# Patient Record
Sex: Male | Born: 1940 | Race: Black or African American | Hispanic: No | Marital: Single | Smoking: Never smoker
Health system: Southern US, Community
[De-identification: ages and names within clinical notes are randomized; demographics above are authoritative.]

## PROBLEM LIST (undated history)

## (undated) DIAGNOSIS — E7800 Pure hypercholesterolemia, unspecified: Secondary | ICD-10-CM

## (undated) DIAGNOSIS — E119 Type 2 diabetes mellitus without complications: Secondary | ICD-10-CM

## (undated) DIAGNOSIS — I10 Essential (primary) hypertension: Secondary | ICD-10-CM

## (undated) DIAGNOSIS — N400 Enlarged prostate without lower urinary tract symptoms: Secondary | ICD-10-CM

---

## 2001-03-09 ENCOUNTER — Ambulatory Visit (HOSPITAL_COMMUNITY): Admission: RE | Admit: 2001-03-09 | Discharge: 2001-03-09 | Payer: Self-pay | Admitting: Internal Medicine

## 2001-03-09 ENCOUNTER — Encounter: Payer: Self-pay | Admitting: Internal Medicine

## 2001-03-23 ENCOUNTER — Ambulatory Visit (HOSPITAL_COMMUNITY): Admission: RE | Admit: 2001-03-23 | Discharge: 2001-03-23 | Payer: Self-pay | Admitting: Internal Medicine

## 2001-03-23 ENCOUNTER — Encounter: Payer: Self-pay | Admitting: Internal Medicine

## 2016-12-09 ENCOUNTER — Other Ambulatory Visit: Payer: Self-pay | Admitting: Urology

## 2016-12-09 DIAGNOSIS — R972 Elevated prostate specific antigen [PSA]: Secondary | ICD-10-CM

## 2017-01-11 ENCOUNTER — Ambulatory Visit
Admission: RE | Admit: 2017-01-11 | Discharge: 2017-01-11 | Disposition: A | Discharge status: Home / Self Care | Payer: Medicare HMO | Source: Ambulatory Visit | Attending: Urology | Admitting: Urology

## 2017-01-11 DIAGNOSIS — R972 Elevated prostate specific antigen [PSA]: Secondary | ICD-10-CM

## 2017-01-11 MED ORDER — GADOBENATE DIMEGLUMINE 529 MG/ML IV SOLN
15.0000 mL | Freq: Once | INTRAVENOUS | Status: AC | PRN
Start: 2017-01-11 — End: 2017-01-11
  Administered 2017-01-11: 15 mL via INTRAVENOUS

## 2017-01-19 ENCOUNTER — Other Ambulatory Visit: Payer: Self-pay

## 2017-03-13 ENCOUNTER — Emergency Department (HOSPITAL_COMMUNITY): Payer: Medicare HMO

## 2017-03-13 ENCOUNTER — Encounter (HOSPITAL_COMMUNITY): Payer: Self-pay

## 2017-03-13 ENCOUNTER — Inpatient Hospital Stay (HOSPITAL_COMMUNITY)
Admission: EM | Admit: 2017-03-13 | Discharge: 2017-03-16 | DRG: 726 | Disposition: A | Discharge status: Home / Self Care | Payer: Medicare HMO | Attending: Family Medicine | Admitting: Family Medicine

## 2017-03-13 DIAGNOSIS — N179 Acute kidney failure, unspecified: Secondary | ICD-10-CM | POA: Diagnosis present

## 2017-03-13 DIAGNOSIS — N138 Other obstructive and reflux uropathy: Secondary | ICD-10-CM | POA: Diagnosis present

## 2017-03-13 DIAGNOSIS — E119 Type 2 diabetes mellitus without complications: Secondary | ICD-10-CM

## 2017-03-13 DIAGNOSIS — R339 Retention of urine, unspecified: Secondary | ICD-10-CM | POA: Diagnosis present

## 2017-03-13 DIAGNOSIS — N390 Urinary tract infection, site not specified: Secondary | ICD-10-CM | POA: Diagnosis present

## 2017-03-13 DIAGNOSIS — B9620 Unspecified Escherichia coli [E. coli] as the cause of diseases classified elsewhere: Secondary | ICD-10-CM | POA: Diagnosis present

## 2017-03-13 DIAGNOSIS — E876 Hypokalemia: Secondary | ICD-10-CM | POA: Diagnosis present

## 2017-03-13 DIAGNOSIS — I10 Essential (primary) hypertension: Secondary | ICD-10-CM | POA: Diagnosis present

## 2017-03-13 DIAGNOSIS — Z7984 Long term (current) use of oral hypoglycemic drugs: Secondary | ICD-10-CM

## 2017-03-13 DIAGNOSIS — N400 Enlarged prostate without lower urinary tract symptoms: Secondary | ICD-10-CM | POA: Diagnosis present

## 2017-03-13 DIAGNOSIS — R34 Anuria and oliguria: Secondary | ICD-10-CM | POA: Diagnosis present

## 2017-03-13 DIAGNOSIS — N401 Enlarged prostate with lower urinary tract symptoms: Secondary | ICD-10-CM | POA: Diagnosis not present

## 2017-03-13 DIAGNOSIS — Z79899 Other long term (current) drug therapy: Secondary | ICD-10-CM

## 2017-03-13 DIAGNOSIS — E785 Hyperlipidemia, unspecified: Secondary | ICD-10-CM | POA: Diagnosis present

## 2017-03-13 HISTORY — DX: Type 2 diabetes mellitus without complications: E11.9

## 2017-03-13 HISTORY — DX: Essential (primary) hypertension: I10

## 2017-03-13 HISTORY — DX: Pure hypercholesterolemia, unspecified: E78.00

## 2017-03-13 HISTORY — DX: Benign prostatic hyperplasia without lower urinary tract symptoms: N40.0

## 2017-03-13 LAB — URINALYSIS, ROUTINE W REFLEX MICROSCOPIC
Bilirubin Urine: NEGATIVE
Glucose, UA: NEGATIVE mg/dL
Ketones, ur: NEGATIVE mg/dL
Nitrite: NEGATIVE
Protein, ur: 30 mg/dL — AB
Specific Gravity, Urine: 1.011 (ref 1.005–1.030)
pH: 5 (ref 5.0–8.0)

## 2017-03-13 LAB — CBC WITH DIFFERENTIAL/PLATELET
BASOS ABS: 0 10*3/uL (ref 0.0–0.1)
Basophils Relative: 0 %
EOS PCT: 0 %
Eosinophils Absolute: 0 10*3/uL (ref 0.0–0.7)
HEMATOCRIT: 32.5 % — AB (ref 39.0–52.0)
HEMOGLOBIN: 11.1 g/dL — AB (ref 13.0–17.0)
LYMPHS PCT: 5 %
Lymphs Abs: 1.4 10*3/uL (ref 0.7–4.0)
MCH: 31.4 pg (ref 26.0–34.0)
MCHC: 34.2 g/dL (ref 30.0–36.0)
MCV: 91.8 fL (ref 78.0–100.0)
MONOS PCT: 7 %
Monocytes Absolute: 2 10*3/uL — ABNORMAL HIGH (ref 0.1–1.0)
NEUTROS PCT: 88 %
Neutro Abs: 24.6 10*3/uL — ABNORMAL HIGH (ref 1.7–7.7)
Platelets: 163 10*3/uL (ref 150–400)
RBC: 3.54 MIL/uL — AB (ref 4.22–5.81)
RDW: 13.6 % (ref 11.5–15.5)
WBC: 28 10*3/uL — AB (ref 4.0–10.5)

## 2017-03-13 LAB — BASIC METABOLIC PANEL
ANION GAP: 10 (ref 5–15)
BUN: 20 mg/dL (ref 6–20)
CHLORIDE: 99 mmol/L — AB (ref 101–111)
CO2: 25 mmol/L (ref 22–32)
Calcium: 8.4 mg/dL — ABNORMAL LOW (ref 8.9–10.3)
Creatinine, Ser: 2.08 mg/dL — ABNORMAL HIGH (ref 0.61–1.24)
GFR calc Af Amer: 34 mL/min — ABNORMAL LOW (ref >60)
GFR, EST NON AFRICAN AMERICAN: 29 mL/min — AB (ref >60)
Glucose, Bld: 159 mg/dL — ABNORMAL HIGH (ref 65–99)
POTASSIUM: 3.8 mmol/L (ref 3.5–5.1)
SODIUM: 134 mmol/L — AB (ref 135–145)

## 2017-03-13 LAB — I-STAT CG4 LACTIC ACID, ED: Lactic Acid, Venous: 1.69 mmol/L (ref 0.5–1.9)

## 2017-03-13 LAB — GLUCOSE, CAPILLARY: GLUCOSE-CAPILLARY: 137 mg/dL — AB (ref 65–99)

## 2017-03-13 MED ORDER — INSULIN ASPART 100 UNIT/ML ~~LOC~~ SOLN
0.0000 [IU] | Freq: Three times a day (TID) | SUBCUTANEOUS | Status: DC
  Administered 2017-03-14 – 2017-03-15 (×2): 1 [IU] via SUBCUTANEOUS
  Administered 2017-03-16: 7 [IU] via SUBCUTANEOUS
  Administered 2017-03-16: 1 [IU] via SUBCUTANEOUS

## 2017-03-13 MED ORDER — ACETAMINOPHEN 325 MG PO TABS
650.0000 mg | ORAL_TABLET | Freq: Four times a day (QID) | ORAL | Status: DC | PRN
  Administered 2017-03-14 – 2017-03-15 (×2): 650 mg via ORAL
  Filled 2017-03-13 (×2): qty 2

## 2017-03-13 MED ORDER — TAMSULOSIN HCL 0.4 MG PO CAPS
0.8000 mg | ORAL_CAPSULE | Freq: Every day | ORAL | Status: DC
  Administered 2017-03-13 – 2017-03-14 (×2): 0.8 mg via ORAL
  Filled 2017-03-13 (×2): qty 2

## 2017-03-13 MED ORDER — INSULIN ASPART 100 UNIT/ML ~~LOC~~ SOLN: 0.0000 [IU] | Freq: Every day | SUBCUTANEOUS | Status: DC

## 2017-03-13 MED ORDER — DEXTROSE 5 % IV SOLN
1.0000 g | Freq: Once | INTRAVENOUS | Status: AC
  Administered 2017-03-13: 1 g via INTRAVENOUS
  Filled 2017-03-13: qty 10

## 2017-03-13 MED ORDER — ENOXAPARIN SODIUM 30 MG/0.3ML ~~LOC~~ SOLN
30.0000 mg | SUBCUTANEOUS | Status: DC
  Administered 2017-03-13: 30 mg via SUBCUTANEOUS
  Filled 2017-03-13: qty 0.3

## 2017-03-13 MED ORDER — HYDRALAZINE HCL 20 MG/ML IJ SOLN: 5.0000 mg | INTRAMUSCULAR | Status: DC | PRN

## 2017-03-13 MED ORDER — DOCUSATE SODIUM 100 MG PO CAPS
100.0000 mg | ORAL_CAPSULE | Freq: Two times a day (BID) | ORAL | Status: DC
  Administered 2017-03-13 – 2017-03-16 (×4): 100 mg via ORAL
  Filled 2017-03-13 (×6): qty 1

## 2017-03-13 MED ORDER — LACTATED RINGERS IV SOLN
INTRAVENOUS | Status: DC
  Administered 2017-03-13 – 2017-03-16 (×5): via INTRAVENOUS

## 2017-03-13 MED ORDER — ACETAMINOPHEN 650 MG RE SUPP: 650.0000 mg | Freq: Four times a day (QID) | RECTAL | Status: DC | PRN

## 2017-03-13 MED ORDER — ONDANSETRON HCL 4 MG PO TABS: 4.0000 mg | ORAL_TABLET | Freq: Four times a day (QID) | ORAL | Status: DC | PRN

## 2017-03-13 MED ORDER — DEXTROSE 5 % IV SOLN
1.0000 g | INTRAVENOUS | Status: DC
  Administered 2017-03-14 – 2017-03-15 (×2): 1 g via INTRAVENOUS
  Filled 2017-03-13 (×3): qty 10

## 2017-03-13 MED ORDER — ONDANSETRON HCL 4 MG/2ML IJ SOLN: 4.0000 mg | Freq: Four times a day (QID) | INTRAMUSCULAR | Status: DC | PRN

## 2017-03-13 MED ORDER — ATORVASTATIN CALCIUM 40 MG PO TABS
40.0000 mg | ORAL_TABLET | Freq: Every day | ORAL | Status: DC
  Administered 2017-03-13 – 2017-03-15 (×3): 40 mg via ORAL
  Filled 2017-03-13 (×3): qty 1

## 2017-03-13 NOTE — ED Notes (Signed)
Bladder scan shows 300 cc in bladder. MD made aware.

## 2017-03-13 NOTE — Progress Notes (Signed)
Rocephin for UTI = 1gm IV every 24 hours. Dose stable for age, weight, renal function and indication. No pharmacokinetic monitoring needed. Sign off. Mady Gemma, [facility] 03/13/2017 9:02 PM

## 2017-03-13 NOTE — ED Triage Notes (Signed)
Having difficulty voiding in the last 2 days.  C/o abdominal pain, rating pain 10/10.

## 2017-03-13 NOTE — ED Notes (Signed)
Patient transported to X-ray 

## 2017-03-13 NOTE — ED Provider Notes (Signed)
[facility] DEPT Provider Note   CSN: [id number] Arrival date & time: 03/13/17  1239     History   Chief Complaint Chief Complaint  Patient presents with  . Urinary Retention    HPI Craig Sims is a 76 y.o. male.  The patient was sent from his PCP office to be evaluated for decreased urinary output.  He saw the doctor today and was sent here.  Patient reports no urinary output for 2 days, and constipation with "small bowel movement," today.  There is been no fever, vomiting, cough, weakness or dizziness.  There are no other known modifying factors.   HPI  Past Medical History:  Diagnosis Date  . Diabetes mellitus without complication (HCC)   . High cholesterol   . Hypertension   . Prostate atrophy     There are no active problems to display for this patient.   History reviewed. No pertinent surgical history.     Home Medications    Prior to Admission medications   Medication Sig Start Date End Date Taking? Authorizing Provider  atorvastatin (LIPITOR) 40 MG tablet Take 40 mg by mouth daily.   Yes [provider]  furosemide (LASIX) 20 MG tablet Take 20 mg by mouth daily.   Yes [provider]  lisinopril (PRINIVIL,ZESTRIL) 5 MG tablet Take 5 mg by mouth daily.   Yes [provider]  metFORMIN (GLUCOPHAGE) 500 MG tablet Take 500 mg by mouth daily with breakfast.   Yes [provider]  tamsulosin (FLOMAX) 0.4 MG CAPS capsule Take 0.4 mg by mouth daily.   Yes [provider]    Family History No family history on file.  Social History Social History  Substance Use Topics  . Smoking status: Never Smoker  . Smokeless tobacco: Never Used  . Alcohol use No     Allergies   Patient has no known allergies.   Review of Systems Review of Systems  All other systems reviewed and are negative.    Physical Exam Updated Vital Signs BP (!) 144/68   Pulse 78   Temp 99.6 F (37.6 C) (Oral)   Resp 18   Ht 5'  10" (1.778 m)   Wt 70.8 kg (156 lb)   SpO2 97%   BMI 22.38 kg/m   Physical Exam  Constitutional: He is oriented to person, place, and time. He appears well-developed.  Elderly, frail  HENT:  Head: Normocephalic and atraumatic.  Right Ear: External ear normal.  Left Ear: External ear normal.  Eyes: Pupils are equal, round, and reactive to light. Conjunctivae and EOM are normal.  Neck: Normal range of motion and phonation normal. Neck supple.  Cardiovascular: Normal rate, regular rhythm and normal heart sounds.   Pulmonary/Chest: Effort normal and breath sounds normal. He exhibits no bony tenderness.  Abdominal: Soft. He exhibits no distension and no mass. There is tenderness (Tender suprapubic, moderate with likely palpable urinary bladder.). There is no guarding.  Musculoskeletal: Normal range of motion.  Neurological: He is alert and oriented to person, place, and time. No cranial nerve deficit or sensory deficit. He exhibits normal muscle tone. Coordination normal.  Skin: Skin is warm, dry and intact.  Psychiatric: He has a normal mood and affect. His behavior is normal.  Nursing note and vitals reviewed.    ED Treatments / Results  Labs (all labs ordered are listed, but only abnormal results are displayed) Labs Reviewed  BASIC METABOLIC PANEL - Abnormal; Notable for the following:  Result Value   Sodium 134 (*)    Chloride 99 (*)    Glucose, Bld 159 (*)    Creatinine, Ser 2.08 (*)    Calcium 8.4 (*)    GFR calc non Af Amer 29 (*)    GFR calc Af Amer 34 (*)    All other components within normal limits  CBC WITH DIFFERENTIAL/PLATELET - Abnormal; Notable for the following:    WBC 28.0 (*)    RBC 3.54 (*)    Hemoglobin 11.1 (*)    HCT 32.5 (*)    Neutro Abs 24.6 (*)    Monocytes Absolute 2.0 (*)    All other components within normal limits  URINALYSIS, ROUTINE W REFLEX MICROSCOPIC - Abnormal; Notable for the following:    APPearance HAZY (*)    Hgb urine dipstick  LARGE (*)    Protein, ur 30 (*)    Leukocytes, UA TRACE (*)    Bacteria, UA MANY (*)    Squamous Epithelial / LPF 0-5 (*)    All other components within normal limits  CULTURE, BLOOD (ROUTINE X 2)  CULTURE, BLOOD (ROUTINE X 2)  URINE CULTURE  I-STAT CG4 LACTIC ACID, ED   BUN  Date Value Ref Range Status  03/13/2017 20 6 - 20 mg/dL Final   Creatinine, Ser  Date Value Ref Range Status  03/13/2017 2.08 (H) 0.61 - 1.24 mg/dL Final     EKG  EKG Interpretation None       Radiology Dg Abd Acute W/chest  Result Date: 03/13/2017 CLINICAL DATA:  Abdominal pain and difficulty voiding for 2 days. EXAM: DG ABDOMEN ACUTE W/ 1V CHEST COMPARISON:  Pelvic MRI on 01/11/2017 FINDINGS: The upright chest x-ray demonstrates normal cardiac size. There is mild tortuosity and calcification of the thoracic aorta. The lungs are clear. No pleural effusion. Two views of the abdomen demonstrate an unremarkable bowel gas pattern. No findings for obstruction an or perforation. The soft tissue shadows are grossly maintained. No worrisome calcifications. The bony structures are intact. IMPRESSION: No acute cardiopulmonary findings. No plain film findings for acute abdominal process. Electronically Signed   By: Rudie Meyer M.D.   On: 03/13/2017 15:54    Procedures Procedures (including critical care time)  Medications Ordered in ED Medications  cefTRIAXone (ROCEPHIN) 1 g in dextrose 5 % 50 mL IVPB (0 g Intravenous Stopped 03/13/17 1750)     Initial Impression / Assessment and Plan / ED Course  I have reviewed the triage vital signs and the nursing notes.  Pertinent labs & imaging results that were available during my care of the patient were reviewed by me and considered in my medical decision making (see chart for details).  Clinical Course as of Mar 14 1843  Mon Mar 13, 2017  1628 High WBC: (!) 28.0 [EW]  1629 Slightly low Hemoglobin: (!) 11.1 [EW]  1629 High Glucose: (!) 159 [EW]  1629 High, no  available comparison Creatinine: (!) 2.08 [EW]  1629 Low GFR, Est Non African American: (!) 29 [EW]  1629 Abnormal RBC / HPF: TOO NUMEROUS TO COUNT [EW]  1630 Nonspecific elevation, urine culture ordered WBC, UA: 6-30 [EW]  1630 Abnormal Bacteria, UA: (!) MANY [EW]  [id number] Patient with fever and primarily urinary symptoms.  Urinalysis abnormal, with normal initial temperature following Tylenol treatment.  Marked elevation of white blood cell count.  Will screen with lactate, and order blood cultures, and initiate antibiotic treatment.  [EW]  1645 Bladder scan showed 300 cc.  At this time the patient has had 1200 cc urinary blood, indicating significant urinary retention after placement of Foley catheter.  Patient and sons are updated on findings and plan.  [EW]    Clinical Course User Index [EW] Mancel Bale, MD     Patient Vitals for the past 24 hrs:  BP Temp Temp src Pulse Resp SpO2 Height Weight  03/13/17 1500 (!) 144/68 - - 78 - 97 % - -  03/13/17 1313 139/67 - - - - - - -  03/13/17 1312 - 99.6 F (37.6 C) Oral 94 18 100 % - -  03/13/17 1311 - - - - - -  (1.778 m) 70.8 kg (156 lb)    5:05 PM Reevaluation with update and discussion. After initial assessment and treatment, an updated evaluation reveals patient feels well and wants to go home.  Findings discussed with patient and family members.  Patient understands that he will require observation evaluation, and further treatment.  Lactate ordered. Arali Somera L   6:41 PM-Consult complete with hospitalist. Patient case explained and discussed.  She agrees to admit patient for further evaluation and treatment. Call ended at 18: 58     Final Clinical Impressions(s) / ED Diagnoses   Final diagnoses:  Urinary retention  Urinary tract infection without hematuria, site unspecified    Decreased urinary output, likely secondary to urinary retention from prostatic hypertrophy.  Likely urinary tract infection, as a contributory  factor.  Marked white blood cell count elevation, and normal lactate.  Patient with symptomatic improvement following placement of Foley catheter.  Creatinine elevated, without recent baseline for comparison.   Nursing Notes Reviewed/ Care Coordinated Applicable Imaging Reviewed Interpretation of Laboratory Data incorporated into ED treatment  Plan: Admit   New Prescriptions New Prescriptions   No medications on file     Mancel Bale, MD 03/13/17 1859

## 2017-03-13 NOTE — H&P (Signed)
History and Physical    Craig Sims:[id number] DOB: 10/26/1940 DOA: 03/13/2017  PCP: Smith Robert, MD Consultants:  [facility] - Lillette Boxer - nephrology Patient coming from:  Home - lives alone; [facility]: sons, [phone]  Chief Complaint: urinary retention  HPI: Craig Sims is a 76 y.o. male with medical history significant of HTN, HLD, DM, BPH< and possible dementia presenting because he was unable to pass urine.  He said it has been a couple of days now.  Anuric completely.  He has not had urinary symptoms prior.  He was feeling a bit dizzy at times.  + headache.  Lower abdominal pain prior to foley, resolved since placement of foley.  Fever to 103 earlier today at his PCP office, given Tylenol with improvement.  History was provided by his 3 sons since the patient was sleeping throughout the evaluation.   ED Course: Urinary retention from BPH.  Also with UTI.  Marked WBC elevation, normal lactate.  Symptomatically improved with foley placement.  Acute renal failure.  Review of Systems: As per HPI; otherwise review of systems reviewed and negative.   Ambulatory Status:  Ambulates without assistance  Past Medical History:  Diagnosis Date  . BPH (benign prostatic hyperplasia)   . Diabetes mellitus without complication (HCC)   . High cholesterol   . Hypertension     History reviewed. No pertinent surgical history.  Social History   Social History  . Marital status: Single    Spouse name: N/A  . Number of children: N/A  . Years of education: N/A   Occupational History  . retired    Social History Main Topics  . Smoking status: Never Smoker  . Smokeless tobacco: Never Used  . Alcohol use No  . Drug use: No  . Sexual activity: Not on file   Other Topics Concern  . Not on file   Social History Narrative  . No narrative on file    No Known Allergies  Family History  Problem Relation Age of Onset  . Cancer Father     Prior to Admission  medications   Medication Sig Start Date End Date Taking? Authorizing Provider  atorvastatin (LIPITOR) 40 MG tablet Take 40 mg by mouth daily.   Yes [provider]  furosemide (LASIX) 20 MG tablet Take 20 mg by mouth daily.   Yes [provider]  lisinopril (PRINIVIL,ZESTRIL) 5 MG tablet Take 5 mg by mouth daily.   Yes [provider]  metFORMIN (GLUCOPHAGE) 500 MG tablet Take 500 mg by mouth daily with breakfast.   Yes [provider]  tamsulosin (FLOMAX) 0.4 MG CAPS capsule Take 0.4 mg by mouth daily.   Yes [provider]    Physical Exam: Vitals:   03/13/17 1311 03/13/17 1312 03/13/17 1313 03/13/17 1500  BP:   139/67 (!) 144/68  Pulse:  94  78  Resp:  18    Temp:  99.6 F (37.6 C)    TempSrc:  Oral    SpO2:  100%  97%  Weight: 70.8 kg (156 lb)     Height:  (1.778 m)        General:  Appears calm and comfortable and is NAD; he slept throughout the history and physical Eyes:  normal lids ENT:  Mmm  Neck:  no LAD, masses or thyromegaly Cardiovascular:  RRR, no m/r/g. No LE edema.  Respiratory:   CTA bilaterally with no wheezes/rales/rhonchi.  Normal respiratory effort. Abdomen:  soft, NT,  ND, NABS Skin:  no rash or induration seen on limited exam Musculoskeletal:  grossly normal tone BUE/BLE, good ROM, no bony abnormality Psychiatric:  Somnolent, appears comfortable Neurologic: unable to perform    Radiological Exams on Admission: Dg Abd Acute W/chest  Result Date: 03/13/2017 CLINICAL DATA:  Abdominal pain and difficulty voiding for 2 days. EXAM: DG ABDOMEN ACUTE W/ 1V CHEST COMPARISON:  Pelvic MRI on 01/11/2017 FINDINGS: The upright chest x-ray demonstrates normal cardiac size. There is mild tortuosity and calcification of the thoracic aorta. The lungs are clear. No pleural effusion. Two views of the abdomen demonstrate an unremarkable bowel gas pattern. No findings for obstruction an or perforation. The soft tissue shadows  are grossly maintained. No worrisome calcifications. The bony structures are intact. IMPRESSION: No acute cardiopulmonary findings. No plain film findings for acute abdominal process. Electronically Signed   By: Rudie Meyer M.D.   On: 03/13/2017 15:54    EKG: none   Labs on Admission: I have personally reviewed the available labs and imaging studies at the time of the admission.  Pertinent labs:   Lactate 1.69 Blood and urine cultures pending Glucose 159 BUN 20/Creatinine 2.08/GFR 34 WBC 28.0 Hgb 11.1 UA: large Hgb, trace LE, 30 protein, many bacteria, TNTC RBC, 6-30 WBC PSA in 2011 23.4, biopsies all negative for malignancy Prostate MRI 01/11/17 - PI-RADS category 2 lesion, probably benign; prostatomegaly with BPH  Assessment/Plan Principal Problem:   Urinary retention Active Problems:   BPH (benign prostatic hyperplasia)   DM (diabetes mellitus) (HCC)   Essential hypertension   Hyperlipidemia   Acute lower UTI   Acute renal failure (ARF) (HCC)   Urinary retention thought to be an obstructive uropathy from BPH leading to UTI and acute renal failure -Uncertain baseline kidney function -Patient presenting with anuria for the last several days -Bladder scan only showed a few hundred cc but foley placed with 1200cc immediate urine return -Suspect obstructive uropathy from BPH since he has known prostatomegaly -He was started on Flomax within the last few months; will increase the dose to 0.8 mg daily -He will need indwelling foley until outpatient urology f/u; at this time there does not appear to be an obvious indication for urology inpatient consult -His urinary stasis appears to have led to a UTI - he has marked leukocytosis, a fever earlier today, and an abnormal UA -Will culture and treat with Rocephin -Also with renal failure - unknown baseline but suspect acute in this context and with ongoing use of Lisinopril and Metformin (holding both) -Will rehydrate with LR and  recheck creatinine in AM -If renal function is improved in AM, patient may be appropriate for discharge with outpatient urology f/u later this week  HTN -Hold Lisinopril due to renal failure -prn IV hydralazine if needed  DM -Will check A1c -hold Glucophage due to renal failure -Cover with sensitive scale SSI  HLD -Continue Lipitor -He is reportedly having some memory loss (?mild dementia) and statins have been associated with this problem; consider risk:benefit analysis by PCP/patient   DVT prophylaxis:  Lovenox  Code Status: Full - confirmed with family Family Communication: Sons present throughout evaluation Disposition Plan:  Home once clinically improved Consults called: None  Admission status: It is my clinical opinion that referral for OBSERVATION is reasonable and necessary in this patient based on the above information provided. The aforementioned taken together are felt to place the patient at high risk for further clinical deterioration. However it is anticipated that the patient may  be medically stable for discharge from the hospital within 24 to 48 hours.     Jonah Blue MD [facility]  If note is complete, please contact covering daytime or nighttime physician. [email or web address] Password [email or web address]  03/13/2017, 7:55 PM

## 2017-03-14 DIAGNOSIS — Z79899 Other long term (current) drug therapy: Secondary | ICD-10-CM | POA: Diagnosis not present

## 2017-03-14 DIAGNOSIS — I10 Essential (primary) hypertension: Secondary | ICD-10-CM

## 2017-03-14 DIAGNOSIS — E118 Type 2 diabetes mellitus with unspecified complications: Secondary | ICD-10-CM | POA: Diagnosis not present

## 2017-03-14 DIAGNOSIS — E785 Hyperlipidemia, unspecified: Secondary | ICD-10-CM

## 2017-03-14 DIAGNOSIS — E119 Type 2 diabetes mellitus without complications: Secondary | ICD-10-CM | POA: Diagnosis present

## 2017-03-14 DIAGNOSIS — Z7984 Long term (current) use of oral hypoglycemic drugs: Secondary | ICD-10-CM | POA: Diagnosis not present

## 2017-03-14 DIAGNOSIS — R338 Other retention of urine: Secondary | ICD-10-CM

## 2017-03-14 DIAGNOSIS — N390 Urinary tract infection, site not specified: Secondary | ICD-10-CM | POA: Diagnosis present

## 2017-03-14 DIAGNOSIS — N401 Enlarged prostate with lower urinary tract symptoms: Principal | ICD-10-CM

## 2017-03-14 DIAGNOSIS — N138 Other obstructive and reflux uropathy: Secondary | ICD-10-CM | POA: Diagnosis present

## 2017-03-14 DIAGNOSIS — R339 Retention of urine, unspecified: Secondary | ICD-10-CM | POA: Diagnosis present

## 2017-03-14 DIAGNOSIS — N179 Acute kidney failure, unspecified: Secondary | ICD-10-CM | POA: Diagnosis present

## 2017-03-14 DIAGNOSIS — R34 Anuria and oliguria: Secondary | ICD-10-CM | POA: Diagnosis present

## 2017-03-14 DIAGNOSIS — E876 Hypokalemia: Secondary | ICD-10-CM | POA: Diagnosis present

## 2017-03-14 DIAGNOSIS — B9620 Unspecified Escherichia coli [E. coli] as the cause of diseases classified elsewhere: Secondary | ICD-10-CM | POA: Diagnosis present

## 2017-03-14 LAB — CBC
HCT: 30 % — ABNORMAL LOW (ref 39.0–52.0)
HEMOGLOBIN: 10.3 g/dL — AB (ref 13.0–17.0)
MCH: 31.5 pg (ref 26.0–34.0)
MCHC: 34.3 g/dL (ref 30.0–36.0)
MCV: 91.7 fL (ref 78.0–100.0)
PLATELETS: 157 10*3/uL (ref 150–400)
RBC: 3.27 MIL/uL — AB (ref 4.22–5.81)
RDW: 13.8 % (ref 11.5–15.5)
WBC: 20.7 10*3/uL — AB (ref 4.0–10.5)

## 2017-03-14 LAB — GLUCOSE, CAPILLARY
GLUCOSE-CAPILLARY: 122 mg/dL — AB (ref 65–99)
GLUCOSE-CAPILLARY: 104 mg/dL — AB (ref 65–99)
Glucose-Capillary: 118 mg/dL — ABNORMAL HIGH (ref 65–99)
Glucose-Capillary: 125 mg/dL — ABNORMAL HIGH (ref 65–99)

## 2017-03-14 LAB — BASIC METABOLIC PANEL
ANION GAP: 8 (ref 5–15)
BUN: 18 mg/dL (ref 6–20)
CHLORIDE: 103 mmol/L (ref 101–111)
CO2: 24 mmol/L (ref 22–32)
CREATININE: 1.66 mg/dL — AB (ref 0.61–1.24)
Calcium: 8 mg/dL — ABNORMAL LOW (ref 8.9–10.3)
GFR calc non Af Amer: 39 mL/min — ABNORMAL LOW (ref >60)
GFR, EST AFRICAN AMERICAN: 45 mL/min — AB (ref >60)
Glucose, Bld: 139 mg/dL — ABNORMAL HIGH (ref 65–99)
POTASSIUM: 3.2 mmol/L — AB (ref 3.5–5.1)
SODIUM: 135 mmol/L (ref 135–145)

## 2017-03-14 LAB — HEMOGLOBIN A1C
Hgb A1c MFr Bld: 6.1 % — ABNORMAL HIGH (ref 4.8–5.6)
Mean Plasma Glucose: 128.37 mg/dL

## 2017-03-14 MED ORDER — TAMSULOSIN HCL 0.4 MG PO CAPS
0.4000 mg | ORAL_CAPSULE | Freq: Two times a day (BID) | ORAL | Status: DC
  Administered 2017-03-14 – 2017-03-16 (×4): 0.4 mg via ORAL
  Filled 2017-03-14 (×4): qty 1

## 2017-03-14 MED ORDER — FINASTERIDE 5 MG PO TABS
5.0000 mg | ORAL_TABLET | Freq: Every day | ORAL | Status: DC
  Administered 2017-03-14 – 2017-03-16 (×3): 5 mg via ORAL
  Filled 2017-03-14 (×4): qty 1

## 2017-03-14 MED ORDER — ENOXAPARIN SODIUM 40 MG/0.4ML ~~LOC~~ SOLN
40.0000 mg | SUBCUTANEOUS | Status: DC
  Administered 2017-03-14 – 2017-03-15 (×2): 40 mg via SUBCUTANEOUS
  Filled 2017-03-14 (×2): qty 0.4

## 2017-03-14 MED ORDER — POTASSIUM CHLORIDE CRYS ER 20 MEQ PO TBCR
40.0000 meq | EXTENDED_RELEASE_TABLET | ORAL | Status: AC
  Administered 2017-03-14 (×2): 40 meq via ORAL
  Filled 2017-03-14 (×2): qty 2

## 2017-03-14 NOTE — Progress Notes (Signed)
PROGRESS NOTE    Craig Sims  GNF:[id number] DOB: 03-Jun-1941 DOA: 03/13/2017 PCP: Smith Robert, MD    Brief Narrative:  76 -year-old male with history of BPH, hypertension and diabetes who presented with abdominal discomfort and inability to pass urine. Foley catheter was placed with resolution of urinary obstruction. He was having fevers on the day prior to admission. Currently on antibiotics with urine cultures in process. Anticipate discharge home in the next 24 hours.   Assessment & Plan:   Principal Problem:   Urinary retention Active Problems:   BPH (benign prostatic hyperplasia)   DM (diabetes mellitus) (HCC)   Essential hypertension   Hyperlipidemia   Acute lower UTI   Acute renal failure (ARF) (HCC)   1. Urinary retention, likely related to prostatic hypertrophy. Foley catheter has been placed with improvement in his urine output. Case discussed with Dr. Berneice Heinrich on call for urology who agrees with discharging the patient with Foley catheter in place. Also, increasing Flomax to twice daily. We will also add finasteride. He'll be followed up by urology in 1 week. 2. Acute renal failure, likely related to postobstructive uropathy. No prior baseline creatinine for comparison. Renal function has improved with Foley catheter placement. Patient appears to be having a postobstructive diuresis with 3.2 L of urine out yesterday. We'll continue with IV fluids to ensure he doesn't get dehydrated. 3. Hypertension. ACE inhibitor on hold due to renal failure. Blood pressure currently stable 4. Diabetes. Metformin currently on hold due to renal failure. On sliding scale. Blood sugars are stable. A1c 6.1 5. Hypokalemia. Replace   DVT prophylaxis: lovenox Code Status: full code Family Communication: discussed with sons at the bedside Disposition Plan: discharge home with foley catheter, likely in AM   Consultants:     Procedures:     Antimicrobials:   Ceftriaxone 10/8>     Subjective: No complaints. No confusion. No abdominal pain. Feeling better  Objective: Vitals:   03/13/17 2000 03/13/17 2056 03/14/17 0605 03/14/17 1450  BP: (!) 111/56 (!) 128/59 118/60 (!) 114/50  Pulse: 82 86 84 75  Resp:  Temp:  98.4 F (36.9 C) 98.1 F (36.7 C) 99 F (37.2 C)  TempSrc:  Oral Oral Oral  SpO2: 97% 99% 100% 100%  Weight:  73.2 kg (161 lb 6 oz)    Height:   (1.778 m)      Intake/Output Summary (Last 24 hours) at 03/14/17 1529 Last data filed at 03/14/17 1500  Gross per 24 hour  Intake          2806.67 ml  Output             3900 ml  Net         -1093.33 ml   Filed Weights   03/13/17 1311 03/13/17 2056  Weight: 70.8 kg (156 lb) 73.2 kg (161 lb 6 oz)    Examination:  General exam: Appears calm and comfortable  Respiratory system: Clear to auscultation. Respiratory effort normal. Cardiovascular system: S1 & S2 heard, RRR. No JVD, murmurs, rubs, gallops or clicks. No pedal edema. Gastrointestinal system: Abdomen is nondistended, soft and nontender. No organomegaly or masses felt. Normal bowel sounds heard. Central nervous system: Alert and oriented. No focal neurological deficits. Extremities: Symmetric 5 x 5 power. Skin: No rashes, lesions or ulcers Psychiatry: Judgement and insight appear normal. Mood & affect appropriate.     Data Reviewed: I have personally reviewed following labs and imaging studies  CBC:  Recent  Labs Lab 03/13/17 1507 03/14/17 0407  WBC 28.0* 20.7*  NEUTROABS 24.6*  --   HGB 11.1* 10.3*  HCT 32.5* 30.0*  MCV 91.8 91.7  PLT 163 157   Basic Metabolic Panel:  Recent Labs Lab 03/13/17 1507 03/14/17 0407  NA 134* 135  K 3.8 3.2*  CL 99* 103  CO2 25 24  GLUCOSE 159* 139*  BUN 20 18  CREATININE 2.08* 1.66*  CALCIUM 8.4* 8.0*   GFR: Estimated Creatinine Clearance: 39.7 mL/min (A) (by C-G formula based on SCr of 1.66 mg/dL (H)). Liver Function Tests: No results for input(s): AST, ALT,  ALKPHOS, BILITOT, PROT, ALBUMIN in the last 168 hours. No results for input(s): LIPASE, AMYLASE in the last 168 hours. No results for input(s): AMMONIA in the last 168 hours. Coagulation Profile: No results for input(s): INR, PROTIME in the last 168 hours. Cardiac Enzymes: No results for input(s): CKTOTAL, CKMB, CKMBINDEX, TROPONINI in the last 168 hours. BNP (last 3 results) No results for input(s): PROBNP in the last 8760 hours. HbA1C:  Recent Labs  03/13/17 2105  HGBA1C 6.1*   CBG:  Recent Labs Lab 03/13/17 2056 03/14/17 0757 03/14/17 1142  GLUCAP 137* 122* 118*   Lipid Profile: No results for input(s): CHOL, HDL, LDLCALC, TRIG, CHOLHDL, LDLDIRECT in the last 72 hours. Thyroid Function Tests: No results for input(s): TSH, T4TOTAL, FREET4, T3FREE, THYROIDAB in the last 72 hours. Anemia Panel: No results for input(s): VITAMINB12, FOLATE, FERRITIN, TIBC, IRON, RETICCTPCT in the last 72 hours. Sepsis Labs:  Recent Labs Lab 03/13/17 1829  LATICACIDVEN 1.69    Recent Results (from the past 240 hour(s))  Culture, blood (routine x 2)     Status: None (Preliminary result)   Collection Time: 03/13/17  4:58 PM  Result Value Ref Range Status   Specimen Description LEFT ANTECUBITAL  Final   Special Requests   Final    BOTTLES DRAWN AEROBIC AND ANAEROBIC Blood Culture adequate volume   Culture NO GROWTH < 24 HOURS  Final   Report Status PENDING  Incomplete  Culture, blood (routine x 2)     Status: None (Preliminary result)   Collection Time: 03/13/17  4:58 PM  Result Value Ref Range Status   Specimen Description RIGHT ANTECUBITAL  Final   Special Requests   Final    BOTTLES DRAWN AEROBIC AND ANAEROBIC Blood Culture adequate volume   Culture NO GROWTH < 24 HOURS  Final   Report Status PENDING  Incomplete         Radiology Studies: Dg Abd Acute W/chest  Result Date: 03/13/2017 CLINICAL DATA:  Abdominal pain and difficulty voiding for 2 days. EXAM: DG ABDOMEN ACUTE  W/ 1V CHEST COMPARISON:  Pelvic MRI on 01/11/2017 FINDINGS: The upright chest x-ray demonstrates normal cardiac size. There is mild tortuosity and calcification of the thoracic aorta. The lungs are clear. No pleural effusion. Two views of the abdomen demonstrate an unremarkable bowel gas pattern. No findings for obstruction an or perforation. The soft tissue shadows are grossly maintained. No worrisome calcifications. The bony structures are intact. IMPRESSION: No acute cardiopulmonary findings. No plain film findings for acute abdominal process. Electronically Signed   By: Rudie Meyer M.D.   On: 03/13/2017 15:54        Scheduled Meds: . atorvastatin  40 mg Oral q1800  . docusate sodium  100 mg Oral BID  . enoxaparin (LOVENOX) injection  40 mg Subcutaneous Q24H  . finasteride  5 mg Oral Daily  . insulin aspart  0-5 Units Subcutaneous QHS  . insulin aspart  0-9 Units Subcutaneous TID WC  . potassium chloride  40 mEq Oral Q3H  . tamsulosin  0.4 mg Oral BID   Continuous Infusions: . cefTRIAXone (ROCEPHIN)  IV    . lactated ringers 100 mL/hr at 03/14/17 0618     LOS: 0 days    Time spent:    MEMON,JEHANZEB, MD [facility] Pager [phone]  If 7PM-7AM, please contact night-coverage [email or web address] Password [email or web address] 03/14/2017, 3:29 PM

## 2017-03-15 LAB — BASIC METABOLIC PANEL
ANION GAP: 9 (ref 5–15)
BUN: 14 mg/dL (ref 6–20)
CALCIUM: 8.3 mg/dL — AB (ref 8.9–10.3)
CO2: 21 mmol/L — ABNORMAL LOW (ref 22–32)
Chloride: 108 mmol/L (ref 101–111)
Creatinine, Ser: 1.53 mg/dL — ABNORMAL HIGH (ref 0.61–1.24)
GFR calc Af Amer: 50 mL/min — ABNORMAL LOW (ref >60)
GFR, EST NON AFRICAN AMERICAN: 43 mL/min — AB (ref >60)
GLUCOSE: 138 mg/dL — AB (ref 65–99)
Potassium: 3.9 mmol/L (ref 3.5–5.1)
Sodium: 138 mmol/L (ref 135–145)

## 2017-03-15 LAB — GLUCOSE, CAPILLARY
GLUCOSE-CAPILLARY: 135 mg/dL — AB (ref 65–99)
GLUCOSE-CAPILLARY: 104 mg/dL — AB (ref 65–99)
Glucose-Capillary: 123 mg/dL — ABNORMAL HIGH (ref 65–99)
Glucose-Capillary: 110 mg/dL — ABNORMAL HIGH (ref 65–99)

## 2017-03-15 LAB — CBC
HCT: 32.8 % — ABNORMAL LOW (ref 39.0–52.0)
HEMOGLOBIN: 11.1 g/dL — AB (ref 13.0–17.0)
MCH: 31.1 pg (ref 26.0–34.0)
MCHC: 33.8 g/dL (ref 30.0–36.0)
MCV: 91.9 fL (ref 78.0–100.0)
PLATELETS: 167 10*3/uL (ref 150–400)
RBC: 3.57 MIL/uL — ABNORMAL LOW (ref 4.22–5.81)
RDW: 13.6 % (ref 11.5–15.5)
WBC: 11.3 10*3/uL — ABNORMAL HIGH (ref 4.0–10.5)

## 2017-03-15 MED ORDER — ACETAMINOPHEN 325 MG PO TABS
650.0000 mg | ORAL_TABLET | Freq: Four times a day (QID) | ORAL | Status: DC
  Administered 2017-03-15 – 2017-03-16 (×5): 650 mg via ORAL
  Filled 2017-03-15 (×5): qty 2

## 2017-03-15 NOTE — Progress Notes (Signed)
PROGRESS NOTE   Craig Sims  WUJ:[id number] DOB: 02-13-1941 DOA: 03/13/2017 PCP: Smith Robert, MD    Brief Narrative:  76 -year-old male with history of BPH, hypertension and diabetes who presented with abdominal discomfort and inability to pass urine. Foley catheter was placed with resolution of urinary obstruction. He was having fevers on the day prior to admission. Currently on antibiotics with urine cultures in process.   Assessment & Plan:   Principal Problem:   Urinary retention Active Problems:   BPH (benign prostatic hyperplasia)   DM (diabetes mellitus) (HCC)   Essential hypertension   Hyperlipidemia   Acute lower UTI   Acute renal failure (ARF) (HCC)   1. Urinary retention secondary to prostatic hypertrophy - Foley catheter has been placed with great improvement in his urine output. Case discussed with Dr. Berneice Heinrich on call for urology who agrees with discharging the patient with Foley catheter in place. Also, increasing Flomax to twice daily. We will also add finasteride. He'll be followed up by urology in 1 week. 2. Acute renal failure, likely related to postobstructive uropathy. No prior baseline creatinine for comparison. Renal function has improved with Foley catheter placement. Patient appears to be having a postobstructive diuresis with 3.2 L of urine out yesterday. Cut back on IVFs today.  3. Hypertension. ACE inhibitor on hold due to renal failure. Blood pressure currently stable 4. UTI - Gram negative rods growing on urine culture - await further culture results for ID and sensitivities.   5. Fever - secondary to UTI, continue tylenol and ceftriaxone, blood cultures NGTD.  6. Diabetes. Metformin currently on hold due to renal failure. On sliding scale. Blood sugars are stable. A1c 6.1 7. Hypokalemia. Replace  DVT prophylaxis: lovenox Code Status: full code Family Communication: discussed with sons at the bedside Disposition Plan: Hopefully home  tomorrow  Antimicrobials:   Ceftriaxone 10/8>    Subjective: Pt still having fevers, denies chills.  Objective: Vitals:   03/14/17 2023 03/14/17 2124 03/14/17 2250 03/15/17 0531  BP:  (!) 115/48  128/63  Pulse:  82  95  Resp:  18  18  Temp:  (!) 101.2 F (38.4 C) 98.8 F (37.1 C) (!) 101.3 F (38.5 C)  TempSrc:  Oral  Oral  SpO2: 96% 99%  99%  Weight:      Height:        Intake/Output Summary (Last 24 hours) at 03/15/17 0945 Last data filed at 03/15/17 0602  Gross per 24 hour  Intake             1400 ml  Output             2250 ml  Net             -850 ml   Filed Weights   03/13/17 1311 03/13/17 2056  Weight: 70.8 kg (156 lb) 73.2 kg (161 lb 6 oz)    Examination:  General exam: NAD. Appears calm and comfortable  Respiratory system: Clear to auscultation. Respiratory effort normal. Cardiovascular system: S1 & S2 heard.  No JVD, murmurs, rubs, gallops or clicks.  Gastrointestinal system: Abdomen is nondistended, soft and nontender. No organomegaly or masses felt. Normal bowel sounds heard. Central nervous system: Alert and oriented. No focal neurological deficits. Extremities: Symmetric 5 x 5 power. Skin: No rashes, lesions or ulcers Psychiatry: Judgement and insight appear normal. Mood & affect appropriate.   Data Reviewed: I have personally reviewed following labs and imaging studies  CBC:  Recent Labs Lab  03/13/17 1507 03/14/17 0407 03/15/17 0603  WBC 28.0* 20.7* 11.3*  NEUTROABS 24.6*  --   --   HGB 11.1* 10.3* 11.1*  HCT 32.5* 30.0* 32.8*  MCV 91.8 91.7 91.9  PLT 163 157 167   Basic Metabolic Panel:  Recent Labs Lab 03/13/17 1507 03/14/17 0407 03/15/17 0603  NA 134* 135 138  K 3.8 3.2* 3.9  CL 99* 103 108  CO2 25 24 21*  GLUCOSE 159* 139* 138*  BUN CREATININE 2.08* 1.66* 1.53*  CALCIUM 8.4* 8.0* 8.3*   GFR: Estimated Creatinine Clearance: 43.1 mL/min (A) (by C-G formula based on SCr of 1.53 mg/dL (H)). Liver Function  Tests: No results for input(s): AST, ALT, ALKPHOS, BILITOT, PROT, ALBUMIN in the last 168 hours. No results for input(s): LIPASE, AMYLASE in the last 168 hours. No results for input(s): AMMONIA in the last 168 hours. Coagulation Profile: No results for input(s): INR, PROTIME in the last 168 hours. Cardiac Enzymes: No results for input(s): CKTOTAL, CKMB, CKMBINDEX, TROPONINI in the last 168 hours. BNP (last 3 results) No results for input(s): PROBNP in the last 8760 hours. HbA1C:  Recent Labs  03/13/17 2105  HGBA1C 6.1*   CBG:  Recent Labs Lab 03/14/17 0757 03/14/17 1142 03/14/17 1646 03/14/17 2118 03/15/17 0751  GLUCAP 122* 118* 104* 125* 135*   Lipid Profile: No results for input(s): CHOL, HDL, LDLCALC, TRIG, CHOLHDL, LDLDIRECT in the last 72 hours. Thyroid Function Tests: No results for input(s): TSH, T4TOTAL, FREET4, T3FREE, THYROIDAB in the last 72 hours. Anemia Panel: No results for input(s): VITAMINB12, FOLATE, FERRITIN, TIBC, IRON, RETICCTPCT in the last 72 hours. Sepsis Labs:  Recent Labs Lab 03/13/17 1829  LATICACIDVEN 1.69    Recent Results (from the past 240 hour(s))  Urine culture     Status: Abnormal (Preliminary result)   Collection Time: 03/13/17  4:31 PM  Result Value Ref Range Status   Specimen Description URINE, CATHETERIZED  Final   Special Requests NONE  Final   Culture >=100,000 COLONIES/mL GRAM NEGATIVE RODS (A)  Final   Report Status PENDING  Incomplete  Culture, blood (routine x 2)     Status: None (Preliminary result)   Collection Time: 03/13/17  4:58 PM  Result Value Ref Range Status   Specimen Description LEFT ANTECUBITAL  Final   Special Requests   Final    BOTTLES DRAWN AEROBIC AND ANAEROBIC Blood Culture adequate volume   Culture NO GROWTH 2 DAYS  Final   Report Status PENDING  Incomplete  Culture, blood (routine x 2)     Status: None (Preliminary result)   Collection Time: 03/13/17  4:58 PM  Result Value Ref Range Status    Specimen Description RIGHT ANTECUBITAL  Final   Special Requests   Final    BOTTLES DRAWN AEROBIC AND ANAEROBIC Blood Culture adequate volume   Culture NO GROWTH 2 DAYS  Final   Report Status PENDING  Incomplete    Radiology Studies: Dg Abd Acute W/chest  Result Date: 03/13/2017 CLINICAL DATA:  Abdominal pain and difficulty voiding for 2 days. EXAM: DG ABDOMEN ACUTE W/ 1V CHEST COMPARISON:  Pelvic MRI on 01/11/2017 FINDINGS: The upright chest x-ray demonstrates normal cardiac size. There is mild tortuosity and calcification of the thoracic aorta. The lungs are clear. No pleural effusion. Two views of the abdomen demonstrate an unremarkable bowel gas pattern. No findings for obstruction an or perforation. The soft tissue shadows are grossly maintained. No worrisome calcifications. The bony structures are  intact. IMPRESSION: No acute cardiopulmonary findings. No plain film findings for acute abdominal process. Electronically Signed   By: Rudie Meyer M.D.   On: 03/13/2017 15:54    Scheduled Meds: . acetaminophen  650 mg Oral Q6H  . atorvastatin  40 mg Oral q1800  . docusate sodium  100 mg Oral BID  . enoxaparin (LOVENOX) injection  40 mg Subcutaneous Q24H  . finasteride  5 mg Oral Daily  . insulin aspart  0-5 Units Subcutaneous QHS  . insulin aspart  0-9 Units Subcutaneous TID WC  . tamsulosin  0.4 mg Oral BID   Continuous Infusions: . cefTRIAXone (ROCEPHIN)  IV Stopped (03/14/17 1744)  . lactated ringers 100 mL/hr at 03/15/17 0258     LOS: 1 day   Time spent: 25 mins  Standley Dakins, MD [facility] Pager [phone]  If 7PM-7AM, please contact night-coverage [email or web address] Password [email or web address] 03/15/2017, 9:45 AM

## 2017-03-16 LAB — GLUCOSE, CAPILLARY
GLUCOSE-CAPILLARY: 128 mg/dL — AB (ref 65–99)
GLUCOSE-CAPILLARY: 321 mg/dL — AB (ref 65–99)

## 2017-03-16 LAB — URINE CULTURE: Culture: >=100000 — AB

## 2017-03-16 MED ORDER — FINASTERIDE 5 MG PO TABS: 5.0000 mg | ORAL_TABLET | Freq: Every day | ORAL | 0 refills | Status: AC

## 2017-03-16 MED ORDER — AMPICILLIN 500 MG PO CAPS: 500.0000 mg | ORAL_CAPSULE | Freq: Three times a day (TID) | ORAL | 0 refills | Status: AC

## 2017-03-16 MED ORDER — TAMSULOSIN HCL 0.4 MG PO CAPS: 0.4000 mg | ORAL_CAPSULE | Freq: Two times a day (BID) | ORAL | 0 refills | Status: AC

## 2017-03-16 MED ORDER — FUROSEMIDE 20 MG PO TABS: 20.0000 mg | ORAL_TABLET | ORAL | Status: DC | PRN

## 2017-03-16 NOTE — Progress Notes (Signed)
PT being D/C'd home via w/c and private automobile accompanied by son.  Pt is being d/c'd home with Foley Catheter in place.  Educated patient and son on proper foley care and maintenance with proper return demonstration.  Standard foley bag emptied and changed to leg bag with strap prior to d/c.  Also, they were given a new standard and leg bag until they are able to f/u with Urology in a week.  Reviewed all instructions of medications and f/u appts.  Son states that patient will be staying with him at his house until he is better.  Pt is d/c'd in stable condition.

## 2017-03-16 NOTE — Care Management Important Message (Signed)
Important Message  Patient Details  Name: Craig Sims MRN: [id number] Date of Birth: 1940/07/19   Medicare Important Message Given:  Yes    Rigby Swamy, Chrystine Oiler, RN 03/16/2017, 9:55 AM

## 2017-03-16 NOTE — Discharge Summary (Signed)
Physician Discharge Summary  BRENDEN RUDMAN ZOX:[id number] DOB: [birth year] DOA: 03/13/2017  PCP: Smith Robert, MD Urologist: Dr. Owens Shark  Admit date: 03/13/2017 Discharge date: 03/16/2017  Admitted From: Home  Disposition: Home   Recommendations for Outpatient Follow-up:  1. Follow up with PCP and urologist in 1 weeks 2. Please obtain BMP/CBC in one week  Discharge Condition: STABLE   CODE STATUS: FULL   Brief Hospitalization Summary: Please see all hospital notes, images, labs for full details of the hospitalization.  HPI: Craig Sims is a 76 y.o. male with medical history significant of HTN, HLD, DM, BPH< and possible dementia presenting because he was unable to pass urine.  He said it has been a couple of days now.  Anuric completely.  He has not had urinary symptoms prior.  He was feeling a bit dizzy at times.  + headache.  Lower abdominal pain prior to foley, resolved since placement of foley.  Fever to 103 earlier today at his PCP office, given Tylenol with improvement.  History was provided by his 3 sons since the patient was sleeping throughout the evaluation.  ED Course: Urinary retention from BPH.  Also with UTI.  Marked WBC elevation, normal lactate.  Symptomatically improved with foley placement.  Acute renal failure.  Brief Narrative:  53 -year-old male with history of BPH, hypertension and diabetes who presented with abdominal discomfort and inability to pass urine. Foley catheter was placed with resolution of urinary obstruction. He was having fevers on the day prior to admission. Currently on antibiotics with urine cultures in process.   Assessment & Plan:   Principal Problem:   Urinary retention Active Problems:   BPH (benign prostatic hyperplasia)   DM (diabetes mellitus) (HCC)   Essential hypertension   Hyperlipidemia   Acute lower UTI   Acute renal failure (ARF) (HCC)  1. Urinary retention secondary to prostatic hypertrophy - Foley catheter has been  placed with great improvement in his urine output. Case discussed with Dr. Berneice Heinrich on call for urology who agrees with discharging the patient with Foley catheter in place. Also, increasing Flomax to twice daily. We will also add finasteride. He'll be followed up by urology in 1 week. 2. Acute renal failure, likely related to postobstructive uropathy. No prior baseline creatinine for comparison. Renal function has improved with Foley catheter placement. Patient appears to be having a postobstructive diuresis.   3. Hypertension.  Blood pressure currently stable. Resume home meds at discharge.  4. UTI - E. Coli positive sensitive to ceftriaxone and ampicillin.  Discharge on 5 more days of oral ampicillin.   5. Fever - resolved now.  secondary to UTI, tylenol as needed.  Blood cultures NGTD.  6. Diabetes. Resume home meds, follow up with PCP.  Blood sugars are stable. A1c 6.1 7. Hypokalemia. Repleted.   DVT prophylaxis: lovenox Code Status: full code Family Communication: discussed with sons at the bedside Disposition Plan: Home  Discharge Diagnoses:  Principal Problem:   Urinary retention Active Problems:   BPH (benign prostatic hyperplasia)   DM (diabetes mellitus) (HCC)   Essential hypertension   Hyperlipidemia   Acute lower UTI   Acute renal failure (ARF) (HCC)  Discharge Instructions: Discharge Instructions    Call MD for:  difficulty breathing, headache or visual disturbances    Complete by:  As directed    Call MD for:  extreme fatigue    Complete by:  As directed    Call MD for:  hives    Complete by:  As directed    Call MD for:  persistant dizziness or light-headedness    Complete by:  As directed    Call MD for:  persistant nausea and vomiting    Complete by:  As directed    Call MD for:  severe uncontrolled pain    Complete by:  As directed    Increase activity slowly    Complete by:  As directed      Allergies as of 03/16/2017   No Known Allergies      Medication List    TAKE these medications   ampicillin 500 MG capsule Commonly known as:  PRINCIPEN Take 1 capsule (500 mg total) by mouth 3 (three) times daily.   atorvastatin 40 MG tablet Commonly known as:  LIPITOR Take 40 mg by mouth daily.   finasteride 5 MG tablet Commonly known as:  PROSCAR Take 1 tablet (5 mg total) by mouth daily.   furosemide 20 MG tablet Commonly known as:  LASIX Take 1 tablet (20 mg total) by mouth every other day as needed for fluid or edema. What changed:  when to take this  reasons to take this   lisinopril 5 MG tablet Commonly known as:  PRINIVIL,ZESTRIL Take 5 mg by mouth daily.   metFORMIN 500 MG tablet Commonly known as:  GLUCOPHAGE Take 500 mg by mouth daily with breakfast.   tamsulosin 0.4 MG Caps capsule Commonly known as:  FLOMAX Take 1 capsule (0.4 mg total) by mouth 2 (two) times daily with a meal. What changed:  when to take this      Follow-up Information    Hildred Laser, MD Follow up on 04/07/2017.   Specialty:  Urology Why:  at 10:15 am Contact information: [address] [place] [address] [phone]        Smith Robert, MD. Schedule an appointment as soon as possible for a visit in 1 week(s).   Specialty:  Family Medicine Why:  Hospital Follow Up  Contact information: [address] [place] [address] [phone]          No Known Allergies Current Discharge Medication List    START taking these medications   Details  ampicillin (PRINCIPEN) 500 MG capsule Take 1 capsule (500 mg total) by mouth 3 (three) times daily. Qty: 15 capsule, Refills: 0    finasteride (PROSCAR) 5 MG tablet Take 1 tablet (5 mg total) by mouth daily. Qty: 30 tablet, Refills: 0      CONTINUE these medications which have CHANGED   Details  furosemide (LASIX) 20 MG tablet Take 1 tablet (20 mg total) by mouth every other day as needed for fluid or edema. Qty: 30 tablet    tamsulosin (FLOMAX) 0.4 MG CAPS  capsule Take 1 capsule (0.4 mg total) by mouth 2 (two) times daily with a meal. Qty: 60 capsule, Refills: 0      CONTINUE these medications which have NOT CHANGED   Details  atorvastatin (LIPITOR) 40 MG tablet Take 40 mg by mouth daily.    lisinopril (PRINIVIL,ZESTRIL) 5 MG tablet Take 5 mg by mouth daily.    metFORMIN (GLUCOPHAGE) 500 MG tablet Take 500 mg by mouth daily with breakfast.        Procedures/Studies: Dg Abd Acute W/chest  Result Date: 03/13/2017 CLINICAL DATA:  Abdominal pain and difficulty voiding for 2 days. EXAM: DG ABDOMEN ACUTE W/ 1V CHEST COMPARISON:  Pelvic MRI on 01/11/2017 FINDINGS: The upright chest x-ray demonstrates normal cardiac size.  There is mild tortuosity and calcification of the thoracic aorta. The lungs are clear. No pleural effusion. Two views of the abdomen demonstrate an unremarkable bowel gas pattern. No findings for obstruction an or perforation. The soft tissue shadows are grossly maintained. No worrisome calcifications. The bony structures are intact. IMPRESSION: No acute cardiopulmonary findings. No plain film findings for acute abdominal process. Electronically Signed   By: Rudie Meyer M.D.   On: 03/13/2017 15:54      Subjective: Pt without complaints.    Discharge Exam: Vitals:   03/15/17 2118 03/16/17 0518  BP:  (!) 142/70  Pulse:  70  Resp:  18  Temp:  98.5 F (36.9 C)  SpO2: 95% 98%   Vitals:   03/15/17 1535 03/15/17 1940 03/15/17 2118 03/16/17 0518  BP: 124/66 118/60  (!) 142/70  Pulse: 70 68  70  Resp: Temp: 99 F (37.2 C) 99 F (37.2 C)  98.5 F (36.9 C)  TempSrc: Oral Oral  Oral  SpO2: 100% 100% 95% 98%  Weight:      Height:       General exam: NAD. Appears calm and comfortable  Respiratory system: Clear to auscultation. Respiratory effort normal. Cardiovascular system: S1 & S2 heard.  No JVD, murmurs, rubs, gallops or clicks.  Gastrointestinal system: Abdomen is nondistended, soft and nontender. No  organomegaly or masses felt. Normal bowel sounds heard. GU: foley in place.  Central nervous system: Alert and oriented. No focal neurological deficits. Extremities: Symmetric 5 x 5 power. Skin: No rashes, lesions or ulcers Psychiatry: Judgement and insight appear normal. Mood & affect appropriate.   The results of significant diagnostics from this hospitalization (including imaging, microbiology, ancillary and laboratory) are listed below for reference.     Microbiology: Recent Results (from the past 240 hour(s))  Urine culture     Status: Abnormal   Collection Time: 03/13/17  4:31 PM  Result Value Ref Range Status   Specimen Description URINE, CATHETERIZED  Final   Special Requests NONE  Final   Culture >=100,000 COLONIES/mL ESCHERICHIA COLI (A)  Final   Report Status 03/16/2017 FINAL  Final   Organism ID, Bacteria ESCHERICHIA COLI (A)  Final      Susceptibility   Escherichia coli - MIC*    AMPICILLIN <=2 SENSITIVE Sensitive     CEFAZOLIN <=4 SENSITIVE Sensitive     CEFTRIAXONE <=1 SENSITIVE Sensitive     CIPROFLOXACIN <=0.25 SENSITIVE Sensitive     GENTAMICIN <=1 SENSITIVE Sensitive     IMIPENEM <=0.25 SENSITIVE Sensitive     NITROFURANTOIN <=16 SENSITIVE Sensitive     TRIMETH/SULFA <=20 SENSITIVE Sensitive     AMPICILLIN/SULBACTAM <=2 SENSITIVE Sensitive     PIP/TAZO <=4 SENSITIVE Sensitive     Extended ESBL NEGATIVE Sensitive     * >=100,000 COLONIES/mL ESCHERICHIA COLI  Culture, blood (routine x 2)     Status: None (Preliminary result)   Collection Time: 03/13/17  4:58 PM  Result Value Ref Range Status   Specimen Description LEFT ANTECUBITAL  Final   Special Requests   Final    BOTTLES DRAWN AEROBIC AND ANAEROBIC Blood Culture adequate volume   Culture NO GROWTH 3 DAYS  Final   Report Status PENDING  Incomplete  Culture, blood (routine x 2)     Status: None (Preliminary result)   Collection Time: 03/13/17  4:58 PM  Result Value Ref Range Status   Specimen  Description RIGHT ANTECUBITAL  Final   Special Requests  Final    BOTTLES DRAWN AEROBIC AND ANAEROBIC Blood Culture adequate volume   Culture NO GROWTH 3 DAYS  Final   Report Status PENDING  Incomplete     Labs: BNP (last 3 results) No results for input(s): BNP in the last 8760 hours. Basic Metabolic Panel:  Recent Labs Lab 03/13/17 1507 03/14/17 0407 03/15/17 0603  NA 134* 135 138  K 3.8 3.2* 3.9  CL 99* 103 108  CO2 25 24 21*  GLUCOSE 159* 139* 138*  BUN CREATININE 2.08* 1.66* 1.53*  CALCIUM 8.4* 8.0* 8.3*   Liver Function Tests: No results for input(s): AST, ALT, ALKPHOS, BILITOT, PROT, ALBUMIN in the last 168 hours. No results for input(s): LIPASE, AMYLASE in the last 168 hours. No results for input(s): AMMONIA in the last 168 hours. CBC:  Recent Labs Lab 03/13/17 1507 03/14/17 0407 03/15/17 0603  WBC 28.0* 20.7* 11.3*  NEUTROABS 24.6*  --   --   HGB 11.1* 10.3* 11.1*  HCT 32.5* 30.0* 32.8*  MCV 91.8 91.7 91.9  PLT 163 157 167   Cardiac Enzymes: No results for input(s): CKTOTAL, CKMB, CKMBINDEX, TROPONINI in the last 168 hours. BNP: Invalid input(s): POCBNP CBG:  Recent Labs Lab 03/15/17 0751 03/15/17 1106 03/15/17 1629 03/15/17 2115 03/16/17 0753  GLUCAP 135* 123* 110* 104* 128*   D-Dimer No results for input(s): DDIMER in the last 72 hours. Hgb A1c  Recent Labs  03/13/17 2105  HGBA1C 6.1*   Lipid Profile No results for input(s): CHOL, HDL, LDLCALC, TRIG, CHOLHDL, LDLDIRECT in the last 72 hours. Thyroid function studies No results for input(s): TSH, T4TOTAL, T3FREE, THYROIDAB in the last 72 hours.  Invalid input(s): FREET3 Anemia work up No results for input(s): VITAMINB12, FOLATE, FERRITIN, TIBC, IRON, RETICCTPCT in the last 72 hours. Urinalysis    Component Value Date/Time   COLORURINE YELLOW 03/13/2017 1449   APPEARANCEUR HAZY (A) 03/13/2017 1449   LABSPEC 1.011 03/13/2017 1449   PHURINE 5.0 03/13/2017 1449    GLUCOSEU NEGATIVE 03/13/2017 1449   HGBUR LARGE (A) 03/13/2017 1449   BILIRUBINUR NEGATIVE 03/13/2017 1449   KETONESUR NEGATIVE 03/13/2017 1449   PROTEINUR 30 (A) 03/13/2017 1449   NITRITE NEGATIVE 03/13/2017 1449   LEUKOCYTESUR TRACE (A) 03/13/2017 1449   Sepsis Labs Invalid input(s): PROCALCITONIN,  WBC,  LACTICIDVEN Microbiology Recent Results (from the past 240 hour(s))  Urine culture     Status: Abnormal   Collection Time: 03/13/17  4:31 PM  Result Value Ref Range Status   Specimen Description URINE, CATHETERIZED  Final   Special Requests NONE  Final   Culture >=100,000 COLONIES/mL ESCHERICHIA COLI (A)  Final   Report Status 03/16/2017 FINAL  Final   Organism ID, Bacteria ESCHERICHIA COLI (A)  Final      Susceptibility   Escherichia coli - MIC*    AMPICILLIN <=2 SENSITIVE Sensitive     CEFAZOLIN <=4 SENSITIVE Sensitive     CEFTRIAXONE <=1 SENSITIVE Sensitive     CIPROFLOXACIN <=0.25 SENSITIVE Sensitive     GENTAMICIN <=1 SENSITIVE Sensitive     IMIPENEM <=0.25 SENSITIVE Sensitive     NITROFURANTOIN <=16 SENSITIVE Sensitive     TRIMETH/SULFA <=20 SENSITIVE Sensitive     AMPICILLIN/SULBACTAM <=2 SENSITIVE Sensitive     PIP/TAZO <=4 SENSITIVE Sensitive     Extended ESBL NEGATIVE Sensitive     * >=100,000 COLONIES/mL ESCHERICHIA COLI  Culture, blood (routine x 2)     Status: None (Preliminary result)   Collection Time:  03/13/17  4:58 PM  Result Value Ref Range Status   Specimen Description LEFT ANTECUBITAL  Final   Special Requests   Final    BOTTLES DRAWN AEROBIC AND ANAEROBIC Blood Culture adequate volume   Culture NO GROWTH 3 DAYS  Final   Report Status PENDING  Incomplete  Culture, blood (routine x 2)     Status: None (Preliminary result)   Collection Time: 03/13/17  4:58 PM  Result Value Ref Range Status   Specimen Description RIGHT ANTECUBITAL  Final   Special Requests   Final    BOTTLES DRAWN AEROBIC AND ANAEROBIC Blood Culture adequate volume   Culture NO  GROWTH 3 DAYS  Final   Report Status PENDING  Incomplete   Time coordinating discharge: 36 mins  SIGNED:  Standley Dakins, MD  [facility] 03/16/2017, 10:49 AM Pager [phone]  If 7PM-7AM, please contact night-coverage [email or web address] Password [email or web address]

## 2017-03-16 NOTE — Care Management Note (Signed)
Case Management Note  Patient Details  Name: CHAZZ PHILSON MRN: [id number] Date of Birth: 1940-07-04    Expected Discharge Date:  03/16/17               Expected Discharge Plan:  Home w Home Health Services  In-House Referral:     Discharge planning Services  CM Consult  Post Acute Care Choice:  Home Health Choice offered to:  Adult Children  DME Arranged:    DME Agency:     HH Arranged:  RN, Nurse's Aide HH Agency:  [facility]  Status of Service:  Completed, signed off  If discussed at Long Length of Stay Meetings, dates discussed:    Additional Comments: Patient discharging home today. MD ordered Home health. Discussed with son via phone. He is agreeable, no preference on agency. Bonita Quin of [facility] notified and will obtain orders from chart. Family aware [facility] has 48 hours to initiate services.   Beula Joyner, Chrystine Oiler, RN 03/16/2017, 11:56 AM

## 2017-03-16 NOTE — Discharge Instructions (Signed)
Follow with Primary MD  Craig Robert, MD  and other consultant's as instructed your Hospitalist MD  Please get a complete blood count and chemistry panel checked by your Primary MD at your next visit, and again as instructed by your Primary MD.  Get Medicines reviewed and adjusted: Please take all your medications with you for your next visit with your Primary MD  Laboratory/radiological data: Please request your Primary MD to go over all hospital tests and procedure/radiological results at the follow up, please ask your Primary MD to get all Hospital records sent to his/her office.  In some cases, they will be blood work, cultures and biopsy results pending at the time of your discharge. Please request that your primary care M.D. follows up on these results.  Also Note the following: If you experience worsening of your admission symptoms, develop shortness of breath, life threatening emergency, suicidal or homicidal thoughts you must seek medical attention immediately by calling 911 or calling your MD immediately  if symptoms less severe.  You must read complete instructions/literature along with all the possible adverse reactions/side effects for all the Medicines you take and that have been prescribed to you. Take any new Medicines after you have completely understood and accpet all the possible adverse reactions/side effects.   Do not drive when taking Pain medications or sleeping medications (Benzodaizepines)  Do not take more than prescribed Pain, Sleep and Anxiety Medications. It is not advisable to combine anxiety,sleep and pain medications without talking with your primary care practitioner  Special Instructions: If you have smoked or chewed Tobacco  in the last 2 yrs please stop smoking, stop any regular Alcohol  and or any Recreational drug use.  Wear Seat belts while driving.  Please note: You were cared for by a hospitalist during your hospital stay. Once you are discharged,  your primary care physician will handle any further medical issues. Please note that NO REFILLS for any discharge medications will be authorized once you are discharged, as it is imperative that you return to your primary care physician (or establish a relationship with a primary care physician if you do not have one) for your post hospital discharge needs so that they can reassess your need for medications and monitor your lab values.

## 2017-03-18 LAB — CULTURE, BLOOD (ROUTINE X 2)
CULTURE: NO GROWTH
CULTURE: NO GROWTH
SPECIAL REQUESTS: ADEQUATE
SPECIAL REQUESTS: ADEQUATE

## 2017-03-24 ENCOUNTER — Encounter (HOSPITAL_COMMUNITY): Payer: Self-pay | Admitting: Emergency Medicine

## 2017-03-24 ENCOUNTER — Emergency Department (HOSPITAL_COMMUNITY)
Admission: EM | Admit: 2017-03-24 | Discharge: 2017-03-24 | Disposition: A | Discharge status: Home / Self Care | Payer: Medicare HMO | Attending: Emergency Medicine | Admitting: Emergency Medicine

## 2017-03-24 DIAGNOSIS — Z79899 Other long term (current) drug therapy: Secondary | ICD-10-CM | POA: Diagnosis not present

## 2017-03-24 DIAGNOSIS — R339 Retention of urine, unspecified: Secondary | ICD-10-CM

## 2017-03-24 DIAGNOSIS — N179 Acute kidney failure, unspecified: Secondary | ICD-10-CM | POA: Diagnosis not present

## 2017-03-24 DIAGNOSIS — I10 Essential (primary) hypertension: Secondary | ICD-10-CM | POA: Diagnosis not present

## 2017-03-24 DIAGNOSIS — E119 Type 2 diabetes mellitus without complications: Secondary | ICD-10-CM | POA: Insufficient documentation

## 2017-03-24 DIAGNOSIS — R1030 Lower abdominal pain, unspecified: Secondary | ICD-10-CM | POA: Diagnosis not present

## 2017-03-24 DIAGNOSIS — Z7984 Long term (current) use of oral hypoglycemic drugs: Secondary | ICD-10-CM | POA: Insufficient documentation

## 2017-03-24 LAB — URINALYSIS, ROUTINE W REFLEX MICROSCOPIC
BILIRUBIN URINE: NEGATIVE
Bacteria, UA: NONE SEEN
GLUCOSE, UA: 50 mg/dL — AB
Ketones, ur: NEGATIVE mg/dL
LEUKOCYTES UA: NEGATIVE
NITRITE: NEGATIVE
PH: 5 (ref 5.0–8.0)
Protein, ur: NEGATIVE mg/dL
Specific Gravity, Urine: 1.013 (ref 1.005–1.030)
Squamous Epithelial / LPF: NONE SEEN

## 2017-03-24 NOTE — ED Provider Notes (Signed)
Patient had Foley catheter pulled out yesterday. He was being treated for urinary retention. He developed feeling of urinary retention again today. He feels much improved since fully catheter placed in the ED   Doug SouJacubowitz, Paulino Cork, MD 03/24/17 1204

## 2017-03-24 NOTE — ED Provider Notes (Signed)
[facility]PART[facility] Provider Note   CSN: [id number] Arrival date & time: 03/24/17  1050     History   Chief Complaint Chief Complaint  Patient presents with  . Urinary Retention    HPI Craig Sims is a 76 y.o. male past medical history of BPH, diabetes who presents with urinary retention since last night. Patient was recently seen in the emergency Department on 03/16/17 for same symptoms. Patient was thought to have urinary retention secondary to BPH. He had a Foley catheter inserted. Patient was also started on ampicillin for treatment of a UTI. He was discharged home with Flomax and instructions to follow up with urology. Patient reports that he was seen by [facility] yesterday. Patient had a trial of void and the Foley catheter was discontinued. Patient comes the emergency permit today with complaints of urinary retention. He reports that he last voided last night. He reports some associated lower abdominal pressure. Patient was scheduled to have a walk-in appointment with [facility] at 12:45 PM this afternoon became the emergency department because symptoms. Patient and son deny any fevers, chest pain, difficult to breathing.   The history is provided by the patient.    Past Medical History:  Diagnosis Date  . BPH (benign prostatic hyperplasia)   . Diabetes mellitus without complication (HCC)   . High cholesterol   . Hypertension     Patient Active Problem List   Diagnosis Date Noted  . Urinary retention 03/13/2017  . BPH (benign prostatic hyperplasia) 03/13/2017  . DM (diabetes mellitus) (HCC) 03/13/2017  . Essential hypertension 03/13/2017  . Hyperlipidemia 03/13/2017  . Acute lower UTI 03/13/2017  . Acute renal failure (ARF) (HCC) 03/13/2017    History reviewed. No pertinent surgical history.     Home Medications    Prior to Admission medications   Medication Sig Start Date End Date Taking? Authorizing Provider  atorvastatin  (LIPITOR) 40 MG tablet Take 40 mg by mouth daily.    [provider]  finasteride (PROSCAR) 5 MG tablet Take 1 tablet (5 mg total) by mouth daily. 03/17/17 04/16/17  Johnson, Clanford L, MD  furosemide (LASIX) 20 MG tablet Take 1 tablet (20 mg total) by mouth every other day as needed for fluid or edema. 03/16/17   Johnson, Clanford L, MD  lisinopril (PRINIVIL,ZESTRIL) 5 MG tablet Take 5 mg by mouth daily.    [provider]  metFORMIN (GLUCOPHAGE) 500 MG tablet Take 500 mg by mouth daily with breakfast.    [provider]  tamsulosin (FLOMAX) 0.4 MG CAPS capsule Take 1 capsule (0.4 mg total) by mouth 2 (two) times daily with a meal. 03/16/17 04/15/17  Cleora FleetJohnson, Clanford L, MD    Family History Family History  Problem Relation Age of Onset  . Cancer Father     Social History Social History  Substance Use Topics  . Smoking status: Never Smoker  . Smokeless tobacco: Never Used  . Alcohol use No     Allergies   Patient has no known allergies.   Review of Systems Review of Systems  Constitutional: Negative for fever.  Respiratory: Negative for shortness of breath.   Cardiovascular: Negative for chest pain.  Gastrointestinal: Positive for abdominal pain.  Genitourinary: Positive for difficulty urinating. Negative for dysuria and hematuria.     Physical Exam Updated Vital Signs BP 133/69 (BP Location: Right Arm)   Pulse 73   Temp 98.6 F (37 C) (Oral)   Resp 18   Ht 5'  10" (1.778 m)   Wt 73 kg (161 lb)   SpO2 100%   BMI 23.10 kg/m   Physical Exam  Constitutional: He appears well-developed and well-nourished.  Appears uncomfortable but no acute distress  HENT:  Head: Normocephalic and atraumatic.  Eyes: Conjunctivae and EOM are normal. Right eye exhibits no discharge. Left eye exhibits no discharge. No scleral icterus.  Cardiovascular: Normal rate and regular rhythm.   Pulses:      Radial pulses are 2+ on the right side, and 2+ on the left  side.  Pulmonary/Chest: Effort normal.  Abdominal: He exhibits distension. There is tenderness.  Mild lower abdominal distention. Mild tenderness to palpation to the lower abdomen.  Neurological: He is alert.  Skin: Skin is warm and dry.  Psychiatric: He has a normal mood and affect. His speech is normal and behavior is normal.  Nursing note and vitals reviewed.    ED Treatments / Results  Labs (all labs ordered are listed, but only abnormal results are displayed) Labs Reviewed  URINALYSIS, ROUTINE W REFLEX MICROSCOPIC    EKG  EKG Interpretation None       Radiology No results found.  Procedures Procedures (including critical care time)  Medications Ordered in ED Medications - No data to display   Initial Impression / Assessment and Plan / ED Course  I have reviewed the triage vital signs and the nursing notes.  Pertinent labs & imaging results that were available during my care of the patient were reviewed by me and considered in my medical decision making (see chart for details).     76 year old male who presents with urinary retention since last night. Seen here on 10/11 for urinary retention assumed secondary to BPH. Had Foley catheter inserted. Saw urologist yesterday and fully catheter was discontinued. Patient also really treated for a UTI with ampicillin. Son reports that he has completed the course of antibiotics. He is still taking Flomax. Patient is afebrile, non-toxic appearing. He does appear uncomfortable. Vital signs reviewed. She is slightly hypertensive. He reports that he did take his medications this morning. Likely secondary to symptoms. Will reassess.  Do not suspect bladder rupture at this time. Do not suspect neurogenic bladder at this time. Symptoms likely result of BPH and removal of Foley catheter.  Charge RN spoke to urology nurse. They recommend reinserting a Foley catheter. They will attempt to see patient today or sometime this weekend.  Recommend patient calling their office after discharge from the emergency department to arrange follow-up appointment. If Foley catheter is stable, patient can be seen on Monday.  Discussed patient with Dr. Ethelda Chick.  Reevaluation of patient after Foley insertion. Patient reports improvement in pain and discomfort. Repeat abdominal exam is improved. Patient abdominal tenderness has resolved. Suspect symptoms were likely secondary to urinary retention. Repeat vitals improved. Blood pressure has normalized. Patient is hemodynamically stable for discharge. We'll plan to send home with Foley catheter and leg bag. Instructed patient and family to call [facility] for arrangement of follow-up appointment. Encourage continued use of the Flomax for symptomatic relief. Strict return precautions discussed. Patient and son expresses understanding and agreement to plan.   Final Clinical Impressions(s) / ED Diagnoses   Final diagnoses:  Urinary retention    New Prescriptions Discharge Medication List as of 03/24/2017 12:18 PM       Maxwell Caul, PA-C 03/24/17 1229    Doug Sou, MD 03/24/17 1626

## 2017-03-24 NOTE — Discharge Instructions (Signed)
Continue taking the Flomax as directed.   Call [facility] for arrangement of a follow-up appointment.   Return to the Emergency Department fro any worsening abdominal pain, urinary difficulty, fevers, blood in the urine or any other worsening or concening symptoms.

## 2017-03-24 NOTE — ED Triage Notes (Signed)
Patient states he had urinary catheter removed yesterday by urologist and has not voided since last night.

## 2018-04-19 ENCOUNTER — Encounter: Payer: Self-pay | Admitting: Neurology

## 2018-04-24 ENCOUNTER — Emergency Department (HOSPITAL_COMMUNITY)
Admission: EM | Admit: 2018-04-24 | Discharge: 2018-04-24 | Disposition: A | Discharge status: Home / Self Care | Payer: Medicare HMO | Attending: Emergency Medicine | Admitting: Emergency Medicine

## 2018-04-24 ENCOUNTER — Encounter (HOSPITAL_COMMUNITY): Payer: Self-pay | Admitting: Emergency Medicine

## 2018-04-24 ENCOUNTER — Emergency Department (HOSPITAL_COMMUNITY): Payer: Medicare HMO

## 2018-04-24 ENCOUNTER — Other Ambulatory Visit: Payer: Self-pay

## 2018-04-24 DIAGNOSIS — F05 Delirium due to known physiological condition: Secondary | ICD-10-CM

## 2018-04-24 DIAGNOSIS — I10 Essential (primary) hypertension: Secondary | ICD-10-CM | POA: Diagnosis not present

## 2018-04-24 DIAGNOSIS — R442 Other hallucinations: Secondary | ICD-10-CM | POA: Insufficient documentation

## 2018-04-24 DIAGNOSIS — Z79899 Other long term (current) drug therapy: Secondary | ICD-10-CM | POA: Diagnosis not present

## 2018-04-24 DIAGNOSIS — R4182 Altered mental status, unspecified: Secondary | ICD-10-CM | POA: Diagnosis present

## 2018-04-24 DIAGNOSIS — F0391 Unspecified dementia with behavioral disturbance: Secondary | ICD-10-CM

## 2018-04-24 DIAGNOSIS — E119 Type 2 diabetes mellitus without complications: Secondary | ICD-10-CM | POA: Insufficient documentation

## 2018-04-24 DIAGNOSIS — Z7984 Long term (current) use of oral hypoglycemic drugs: Secondary | ICD-10-CM | POA: Diagnosis not present

## 2018-04-24 LAB — CBC WITH DIFFERENTIAL/PLATELET
Abs Immature Granulocytes: 0.01 10*3/uL (ref 0.00–0.07)
BASOS ABS: 0 10*3/uL (ref 0.0–0.1)
Basophils Relative: 1 %
EOS ABS: 0 10*3/uL (ref 0.0–0.5)
EOS PCT: 0 %
HEMATOCRIT: 37.8 % — AB (ref 39.0–52.0)
HEMOGLOBIN: 11.9 g/dL — AB (ref 13.0–17.0)
Immature Granulocytes: 0 %
LYMPHS ABS: 0.9 10*3/uL (ref 0.7–4.0)
LYMPHS PCT: 19 %
MCH: 30.2 pg (ref 26.0–34.0)
MCHC: 31.5 g/dL (ref 30.0–36.0)
MCV: 95.9 fL (ref 80.0–100.0)
MONOS PCT: 8 %
Monocytes Absolute: 0.4 10*3/uL (ref 0.1–1.0)
NRBC: 0 % (ref 0.0–0.2)
Neutro Abs: 3.7 10*3/uL (ref 1.7–7.7)
Neutrophils Relative %: 72 %
Platelets: 212 10*3/uL (ref 150–400)
RBC: 3.94 MIL/uL — ABNORMAL LOW (ref 4.22–5.81)
RDW: 12.7 % (ref 11.5–15.5)
WBC: 5.1 10*3/uL (ref 4.0–10.5)

## 2018-04-24 LAB — COMPREHENSIVE METABOLIC PANEL
ALBUMIN: 3.8 g/dL (ref 3.5–5.0)
ALK PHOS: 39 U/L (ref 38–126)
ALT: 17 U/L (ref 0–44)
ANION GAP: 7 (ref 5–15)
AST: 15 U/L (ref 15–41)
BILIRUBIN TOTAL: 0.6 mg/dL (ref 0.3–1.2)
BUN: 20 mg/dL (ref 8–23)
CALCIUM: 9.1 mg/dL (ref 8.9–10.3)
CO2: 24 mmol/L (ref 22–32)
Chloride: 107 mmol/L (ref 98–111)
Creatinine, Ser: 1.6 mg/dL — ABNORMAL HIGH (ref 0.61–1.24)
GFR calc Af Amer: 47 mL/min — ABNORMAL LOW (ref >60)
GFR, EST NON AFRICAN AMERICAN: 40 mL/min — AB (ref >60)
GLUCOSE: 160 mg/dL — AB (ref 70–99)
Potassium: 3.8 mmol/L (ref 3.5–5.1)
Sodium: 138 mmol/L (ref 135–145)
TOTAL PROTEIN: 7.2 g/dL (ref 6.5–8.1)

## 2018-04-24 LAB — AMMONIA: AMMONIA: 18 umol/L (ref 9–35)

## 2018-04-24 LAB — URINALYSIS, ROUTINE W REFLEX MICROSCOPIC
Bilirubin Urine: NEGATIVE
Glucose, UA: NEGATIVE mg/dL
HGB URINE DIPSTICK: NEGATIVE
KETONES UR: NEGATIVE mg/dL
Leukocytes, UA: NEGATIVE
Nitrite: NEGATIVE
PROTEIN: NEGATIVE mg/dL
Specific Gravity, Urine: 1.021 (ref 1.005–1.030)
pH: 5 (ref 5.0–8.0)

## 2018-04-24 LAB — I-STAT TROPONIN, ED: Troponin i, poc: 0 ng/mL (ref 0.00–0.08)

## 2018-04-24 LAB — I-STAT CG4 LACTIC ACID, ED: Lactic Acid, Venous: 1.57 mmol/L (ref 0.5–1.9)

## 2018-04-24 MED ORDER — SODIUM CHLORIDE 0.9 % IV SOLN
1000.0000 mL | Freq: Once | INTRAVENOUS | Status: AC
  Administered 2018-04-24: 1000 mL via INTRAVENOUS

## 2018-04-24 NOTE — ED Triage Notes (Signed)
BIB son with c/o "my memory is leaving me" son states that he has been having some confusion and forgetfulness lately- pt lives by himself and has been wandering around. Family has been staying with pt at night, and has been having "episodes" and paranoia "like someone is after him"/  Sons are at bedside- pt claimed that he has been seeing things at night.

## 2018-04-24 NOTE — Discharge Instructions (Addendum)
Get help right away if: You develop a fever. You have new or worsening confusion. You have new or worsening sleepiness. You have a hard time staying awake. You or your family members become concerned for your safety.

## 2018-04-24 NOTE — ED Provider Notes (Signed)
[facility]   CSN: [id number] Arrival date & time: 04/24/18  [id number]     History   Chief Complaint Chief Complaint  Patient presents with  . Altered Mental Status    HPI Craig Sims is a 77 y.o. male who presents the emergency department brought in by his 2 sons for altered mental status.  His one son states "we just want to see if he could run some test to see if he has Alzheimer's or dementia."  According to both sons the patient has had worsening memory issues, confusion over the past several months.  These have gotten much worse over the past few weeks.  He has begun wandering at times and has had episodes of hallucinations at night.  His son states that they have seen their primary care doctor and has a follow-up with neurology next Tuesday.  The patient told his sons that he was sick of "feeling this way"  HPI  Past Medical History:  Diagnosis Date  . BPH (benign prostatic hyperplasia)   . Diabetes mellitus without complication (HCC)   . High cholesterol   . Hypertension     Patient Active Problem List   Diagnosis Date Noted  . Urinary retention 03/13/2017  . BPH (benign prostatic hyperplasia) 03/13/2017  . DM (diabetes mellitus) (HCC) 03/13/2017  . Essential hypertension 03/13/2017  . Hyperlipidemia 03/13/2017  . Acute lower UTI 03/13/2017  . Acute renal failure (ARF) (HCC) 03/13/2017    History reviewed. No pertinent surgical history.      Home Medications    Prior to Admission medications   Medication Sig Start Date End Date Taking? Authorizing Provider  atorvastatin (LIPITOR) 40 MG tablet Take 40 mg by mouth daily.   Yes [provider]  Cholecalciferol (VITAMIN D3) 50 MCG (2000 UT) TABS Take 2,000 Units by mouth 2 (two) times daily. 04/05/18  Yes [provider]  finasteride (PROSCAR) 5 MG tablet Take 5 mg by mouth daily. 04/05/18  Yes [provider]  lisinopril  (PRINIVIL,ZESTRIL) 5 MG tablet Take 5 mg by mouth daily.   Yes [provider]  metFORMIN (GLUCOPHAGE) 500 MG tablet Take 500 mg by mouth daily with breakfast.   Yes [provider]  tamsulosin (FLOMAX) 0.4 MG CAPS capsule Take 0.4 mg by mouth 2 (two) times daily. 04/05/18  Yes [provider]  furosemide (LASIX) 20 MG tablet Take 1 tablet (20 mg total) by mouth every other day as needed for fluid or edema. Patient not taking: Reported on 04/24/2018 03/16/17   Cleora FleetJohnson, Clanford L, MD    Family History Family History  Problem Relation Age of Onset  . Cancer Father     Social History Social History   Tobacco Use  . Smoking status: Never Smoker  . Smokeless tobacco: Never Used  Substance Use Topics  . Alcohol use: No  . Drug use: No     Allergies   Patient has no known allergies.   Review of Systems Review of Systems  Unable to perform ROS: Dementia     Physical Exam Updated Vital Signs BP (!) 144/77   Pulse 82   Temp 98.3 F (36.8 C) (Oral)   Resp (!) 21   SpO2 100%   Physical Exam  Constitutional: He appears well-developed and well-nourished. No distress.  HENT:  Head: Normocephalic and atraumatic.  Eyes: Conjunctivae are normal. No scleral icterus.  Neck: Normal range of motion. Neck supple.  Cardiovascular: Normal rate,  regular rhythm and normal heart sounds.  Pulmonary/Chest: Effort normal and breath sounds normal. No respiratory distress.  Abdominal: Soft. There is no tenderness.  Musculoskeletal: He exhibits no edema.  Neurological: He is alert. He is disoriented.  Skin: Skin is warm and dry. He is not diaphoretic.  Psychiatric: His behavior is normal.  Nursing note and vitals reviewed.    ED Treatments / Results  Labs (all labs ordered are listed, but only abnormal results are displayed) Labs Reviewed  CBC WITH DIFFERENTIAL/PLATELET - Abnormal; Notable for the following components:      Result Value   RBC 3.94 (*)     Hemoglobin 11.9 (*)    HCT 37.8 (*)    All other components within normal limits  COMPREHENSIVE METABOLIC PANEL - Abnormal; Notable for the following components:   Glucose, Bld 160 (*)    Creatinine, Ser 1.60 (*)    GFR calc non Af Amer 40 (*)    GFR calc Af Amer 47 (*)    All other components within normal limits  URINALYSIS, ROUTINE W REFLEX MICROSCOPIC  AMMONIA  CBG MONITORING, ED  I-STAT CG4 LACTIC ACID, ED  I-STAT TROPONIN, ED    EKG EKG Interpretation  Date/Time:  Tuesday April 24 2018 09:32:24 EST Ventricular Rate:  106 PR Interval:    QRS Duration: 89 QT Interval:  348 QTC Calculation: 463 R Axis:   70 Text Interpretation:  Sinus tachycardia Minimal ST depression, lateral leads Baseline wander in lead(s) II III aVF V3 V6 No previous ECGs available Confirmed by Alvira Monday ([id number]) on 04/24/2018 10:41:25 AM   Radiology Ct Head Wo Contrast  Result Date: 04/24/2018 CLINICAL DATA:  77 year old male with history of loss of consciousness. EXAM: CT HEAD WITHOUT CONTRAST TECHNIQUE: Contiguous axial images were obtained from the base of the skull through the vertex without intravenous contrast. COMPARISON:  No priors. FINDINGS: Brain: Mild cerebral atrophy. Patchy and confluent areas of decreased attenuation are noted throughout the deep and periventricular white matter of the cerebral hemispheres bilaterally, compatible with chronic microvascular ischemic disease. No evidence of acute infarction, hemorrhage, hydrocephalus, extra-axial collection or mass lesion/mass effect. Vascular: No hyperdense vessel or unexpected calcification. Skull: Normal. Negative for fracture or focal lesion. Sinuses/Orbits: No acute finding. Other: None. IMPRESSION: 1. No acute intracranial abnormalities. 2. Mild cerebral atrophy with chronic microvascular ischemic changes in the cerebral white matter, as above. Electronically Signed   By: Trudie Reed M.D.   On: 04/24/2018 10:34     Procedures Procedures (including critical care time)  Medications Ordered in ED Medications  0.9 %  sodium chloride infusion (1,000 mLs Intravenous New Bag/Given 04/24/18 1119)     Initial Impression / Assessment and Plan / ED Course  I have reviewed the triage vital signs and the nursing notes.  Pertinent labs & imaging results that were available during my care of the patient were reviewed by me and considered in my medical decision making (see chart for details).     Patient with worsening dementia, new hallucinations and paranoia.  He is also sundowning.  His labs appear to be at baseline.  CT head negative.  Urinalysis without evidence of infection.  Normal pneumonia.  I reviewed the patient's EKG he is not having any active chest pain at this time.  Patient seen and see her visit with Dr. Dalene Seltzer.  Patient given outpatient resources.  He has follow-up with neuro psychiatry tomorrow at 1:45 PM  Final Clinical Impressions(s) / ED Diagnoses   Final  diagnoses:  Dementia with behavioral disturbance, unspecified dementia type [facility])  Sundowning    ED Discharge Orders    None       Arthor Captain, PA-C 04/24/18 1738    Alvira Monday, MD 04/27/18 1122

## 2018-04-25 ENCOUNTER — Ambulatory Visit: Payer: Medicare HMO | Admitting: Neurology

## 2018-04-25 ENCOUNTER — Encounter: Payer: Self-pay | Admitting: Neurology

## 2018-04-25 VITALS — BP 150/80 | HR 99 | Ht 69.5 in | Wt 159.2 lb

## 2018-04-25 DIAGNOSIS — F0391 Unspecified dementia with behavioral disturbance: Secondary | ICD-10-CM | POA: Diagnosis not present

## 2018-04-25 DIAGNOSIS — F03B18 Unspecified dementia, moderate, with other behavioral disturbance: Secondary | ICD-10-CM

## 2018-04-25 MED ORDER — QUETIAPINE FUMARATE 25 MG PO TABS: ORAL_TABLET | ORAL | 6 refills | Status: DC

## 2018-04-25 NOTE — Patient Instructions (Signed)
1. Start Seroquel 25mg : Take 1/2 tablet every night for 3 days, then increase to 1 tablet every night. Call our office for an update in a week, we can further increase dose if necessary.  2. After a month of starting the Seroquel, we can start the medication to help slow down worsening of memory, Donepezil. Call our office in a month and a prescription will be sent.  3. Follow-up in 6 months, call for any changes.  FALL PRECAUTIONS: Be cautious when walking. Scan the area for obstacles that may increase the risk of trips and falls. When getting up in the mornings, sit up at the edge of the bed for a few minutes before getting out of bed. Consider elevating the bed at the head end to avoid drop of blood pressure when getting up. Walk always in a well-lit room (use night lights in the walls). Avoid area rugs or power cords from appliances in the middle of the walkways. Use a walker or a cane if necessary and consider physical therapy for balance exercise. Get your eyesight checked regularly.  FINANCIAL OVERSIGHT: Supervision, especially oversight when making financial decisions or transactions is also recommended.  HOME SAFETY: Consider the safety of the kitchen when operating appliances like stoves, microwave oven, and blender. Consider having supervision and share cooking responsibilities until no longer able to participate in those. Accidents with firearms and other hazards in the house should be identified and addressed as well.  DRIVING: Regarding driving, in patients with progressive memory problems, driving will be impaired. We advise to have someone else do the driving if trouble finding directions or if minor accidents are reported. Independent driving assessment is available to determine safety of driving.  ABILITY TO BE LEFT ALONE: If patient is unable to contact 911 operator, consider using LifeLine, or when the need is there, arrange for someone to stay with patients. Smoking is a fire  hazard, consider supervision or cessation. Risk of wandering should be assessed by caregiver and if detected at any point, supervision and safe proof recommendations should be instituted.  MEDICATION SUPERVISION: Inability to self-administer medication needs to be constantly addressed. Implement a mechanism to ensure safe administration of the medications.  RECOMMENDATIONS FOR ALL PATIENTS WITH MEMORY PROBLEMS: 1. Continue to exercise (Recommend 30 minutes of walking everyday, or 3 hours every week) 2. Increase social interactions - continue going to [place] and enjoy social gatherings with friends and family 3. Eat healthy, avoid fried foods and eat more fruits and vegetables 4. Maintain adequate blood pressure, blood sugar, and blood cholesterol level. Reducing the risk of stroke and cardiovascular disease also helps promoting better memory. 5. Avoid stressful situations. Live a simple life and avoid aggravations. Organize your time and prepare for the next day in anticipation. 6. Sleep well, avoid any interruptions of sleep and avoid any distractions in the bedroom that may interfere with adequate sleep quality 7. Avoid sugar, avoid sweets as there is a strong link between excessive sugar intake, diabetes, and cognitive impairment The Mediterranean diet has been shown to help patients reduce the risk of progressive memory disorders and reduces cardiovascular risk. This includes eating fish, eat fruits and green leafy vegetables, nuts like almonds and hazelnuts, walnuts, and also use olive oil. Avoid fast foods and fried foods as much as possible. Avoid sweets and sugar as sugar use has been linked to worsening of memory function.  There is always a concern of gradual progression of memory problems. If this is the case, then  we may need to adjust level of care according to patient needs. Support, both to the patient and caregiver, should then be put into place.

## 2018-04-25 NOTE — Progress Notes (Signed)
NEUROLOGY CONSULTATION NOTE  Craig Sims MRN: [id number] DOB: 05/16/1941  Referring provider: Dr. Smith Robert Primary care provider: Dr. Smith Robert  Reason for consult:  dementia  Dear Dr Bryna Colander:  Thank you for your kind referral of Craig Sims for consultation of the above symptoms. Although his history is well known to you, please allow me to reiterate it for the purpose of our medical record. The patient was accompanied to the clinic by his 2 sons who also provide collateral information. Records and images were personally reviewed where available.  HISTORY OF PRESENT ILLNESS: This is a 77 year old right-handed man with a history of hypertension, hyperlipidemia, diabetes, presenting for evaluation of dementia. He feels his memory is "fair, can't remember like before." He lives alone. His sons started noticing changes around a year ago, they would have conversations and he would drift off to a different topic. His sons report that over the past 6 months, he would be awake at night turning all the lights with visual hallucinations. Last night he saw a little child in a corner with red shoes on. Hallucinations are mostly in the evening, they report he is okay during the daytime. He is easily agitated. He started having paranoia 2 months ago, thinking someone is trying to come in the house or someone is inside the house. He rarely drives but drove a week ago, denies getting lost. His son has always managed his bills. His sons have started fixing his pillbox and making sure he takes his medications. Sons have started staying with him for the past week due to the worsening hallucinations. His family brought him to the ER yesterday due to worsening symptoms, he had a head CT without contrast which I personally reviewed, no acute changes, there was mild diffuse atrophy and chronic microvascular disease.  He has occasional left hand tremors. He has occasional headaches and dizziness when  standing. He denies any diplopia, dysarthria/dysphagia, neck/back pain, focal numbness/tingling/weakness, bowel/bladder dysfunction. He has noticed reduced sense of smell. He naps during the day and gets fidgety at night, but does not wander outside the house. No family history of dementia. No history of significant head injuries or alcohol use.   PAST MEDICAL HISTORY: Past Medical History:  Diagnosis Date  . BPH (benign prostatic hyperplasia)   . Diabetes mellitus without complication (HCC)   . High cholesterol   . Hypertension     PAST SURGICAL HISTORY: History reviewed. No pertinent surgical history.  MEDICATIONS:  Outpatient Encounter Medications as of 04/25/2018  Medication Sig  . atorvastatin (LIPITOR) 40 MG tablet Take 20 mg by mouth daily.   . Cholecalciferol (VITAMIN D3) 50 MCG (2000 UT) TABS Take 2,000 Units by mouth 2 (two) times daily.  . finasteride (PROSCAR) 5 MG tablet Take 5 mg by mouth daily.  . furosemide (LASIX) 20 MG tablet Take 1 tablet (20 mg total) by mouth every other day as needed for fluid or edema.  Marland Kitchen lisinopril (PRINIVIL,ZESTRIL) 5 MG tablet Take 5 mg by mouth daily.  . metFORMIN (GLUCOPHAGE) 500 MG tablet Take 500 mg by mouth daily with breakfast.  . tamsulosin (FLOMAX) 0.4 MG CAPS capsule Take 0.4 mg by mouth 2 (two) times daily.  .     No facility-administered encounter medications on file as of 04/25/2018.     ALLERGIES: No Known Allergies  FAMILY HISTORY: Family History  Problem Relation Age of Onset  . Cancer Father     SOCIAL HISTORY: Social History  Socioeconomic History  . Marital status: Single    Spouse name: Not on file  . Number of children: Not on file  . Years of education: Not on file  . Highest education level: Not on file  Occupational History  . Occupation: retired  Engineer, productionocial Needs  . Financial resource strain: Not on file  . Food insecurity:    Worry: Not on file    Inability: Not on file  . Transportation needs:     Medical: Not on file    Non-medical: Not on file  Tobacco Use  . Smoking status: Never Smoker  . Smokeless tobacco: Never Used  Substance and Sexual Activity  . Alcohol use: No  . Drug use: No  . Sexual activity: Not on file  Lifestyle  . Physical activity:    Days per week: Not on file    Minutes per session: Not on file  . Stress: Not on file  Relationships  . Social connections:    Talks on phone: Not on file    Gets together: Not on file    Attends religious service: Not on file    Active member of club or organization: Not on file    Attends meetings of clubs or organizations: Not on file    Relationship status: Not on file  . Intimate partner violence:    Fear of current or ex partner: Not on file    Emotionally abused: Not on file    Physically abused: Not on file    Forced sexual activity: Not on file  Other Topics Concern  . Not on file  Social History Narrative  . Not on file    REVIEW OF SYSTEMS: Constitutional: No fevers, chills, or sweats, no generalized fatigue, change in appetite Eyes: No visual changes, double vision, eye pain Ear, nose and throat: No hearing loss, ear pain, nasal congestion, sore throat Cardiovascular: No chest pain, palpitations Respiratory:  No shortness of breath at rest or with exertion, wheezes GastrointestinaI: No nausea, vomiting, diarrhea, abdominal pain, fecal incontinence Genitourinary:  No dysuria, urinary retention or frequency Musculoskeletal:  No neck pain, back pain Integumentary: No rash, pruritus, skin lesions Neurological: as above Psychiatric: No depression, insomnia, anxiety Endocrine: No palpitations, fatigue, diaphoresis, mood swings, change in appetite, change in weight, increased thirst Hematologic/Lymphatic:  No anemia, purpura, petechiae. Allergic/Immunologic: no itchy/runny eyes, nasal congestion, recent allergic reactions, rashes  PHYSICAL EXAM: Vitals:   04/25/18 1308  BP: (!) 150/80  Pulse: 99  SpO2:  98%   General: No acute distress Head:  Normocephalic/atraumatic Eyes: Fundoscopic exam shows bilateral sharp discs, no vessel changes, exudates, or hemorrhages Neck: supple, no paraspinal tenderness, full range of motion Back: No paraspinal tenderness Heart: regular rate and rhythm Lungs: Clear to auscultation bilaterally. Vascular: No carotid bruits. Skin/Extremities: No rash, no edema Neurological Exam: Mental status: alert and oriented to person, place, year/season. States it is Sept 18 (it is Nov 20). No dysarthria or aphasia, Fund of knowledge is reduced.  Recent and remote memory are reduced.  Attention and concentration are reduced. Unable to spell WORLD, do serial 7s, or say days of week backward) Able to name objects and repeat phrases. He is illiterate and unable to spell or read. MMSE - Mini Mental State Exam 04/25/2018  Orientation to time 3  Orientation to Place 5  Registration 3  Attention/ Calculation 0   Recall 0  Language- name 2 objects 2  Language- repeat 1  Language- follow 3 step command 3  Language- read & follow direction - (unable to read/write)  Write a sentence - (unable to read/write)  Copy design 0  Total score 17/28   Cranial nerves: CN I: not tested CN II: pupils equal, round and reactive to light, visual fields intact, fundi unremarkable. CN III, IV, VI:  full range of motion, no nystagmus, no ptosis CN V: facial sensation intact CN VII: upper and lower face symmetric CN VIII: hearing intact to finger rub CN IX, X: gag intact, uvula midline CN XI: sternocleidomastoid and trapezius muscles intact CN XII: tongue midline Bulk & Tone: normal, no fasciculations. Motor: 5/5 throughout with no pronator drift. Sensation: intact to light touch, cold, pin, vibration and joint position sense.  No extinction to double simultaneous stimulation.  Romberg test negative Deep Tendon Reflexes: +1 throughout, no ankle clonus Plantar responses: downgoing  bilaterally Cerebellar: no incoordination on finger to nose, heel to shin. No dysdiadochokinesia Gait: narrow-based and steady, able to tandem walk adequately. Tremor: none  IMPRESSION: This is a 77 year old right-handed man with a history of hypertension, hyperlipidemia, diabetes, presenting for evaluation of dementia. MMSE today 17/28 (patient illiterate). Head CT no acute changes. 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 He is having hallucinations, Lewy Body dementia is considered, no parkinsonian signs on exam. We discussed symptomatic treatment of behavioral changes with Seroquel, start 12.5 mg qhs x 3 days, then increase to 25mg  qhs, we may further uptitrate as tolerated. Side effects, including black box cardiac warning, were discussed. His family is interested in starting Donepezil, we discussed starting one medication at a time, and will plan to start Donepezil in a month. Sons will call our office for an update and prescription for Donepezil will be sent. Continue 24/7 supervision, no further driving. Community resources provided to his sons. We discussed the importance of control of vascular risk factors, physical exercise, and brain stimulation exercises for brain health. Follow-up in 6 months, they know to call for any changes.   Thank you for allowing me to participate in the care of this patient. Please do not hesitate to call for any questions or concerns.   Patrcia Dolly, M.D.  CC: Dr. Bryna Colander

## 2018-05-01 ENCOUNTER — Ambulatory Visit: Payer: Medicare HMO | Admitting: Neurology

## 2018-05-04 ENCOUNTER — Encounter: Payer: Self-pay | Admitting: Neurology

## 2018-06-19 ENCOUNTER — Telehealth: Payer: Self-pay | Admitting: Neurology

## 2018-06-19 MED ORDER — DONEPEZIL HCL 10 MG PO TABS: ORAL_TABLET | ORAL | 5 refills | Status: DC

## 2018-06-19 NOTE — Telephone Encounter (Signed)
Spoke with pt's son, Fayrene Fearing.  Pt is doing well since starting Seroquel - some night better than others, but over all pt doing better.  Reading LOV note, pt is to have Rx for Donepezil sent to pharmacy 1 month post starting Seroquel.  Charlott Holler that I would send Rx now and it should be ready for pick up later this afternoon.    Dr. Karel Jarvis - ? Donepezil 10mg  #30, with 5 refills, half tab x2weeks, increase to 1 tab daily

## 2018-06-19 NOTE — Telephone Encounter (Signed)
Yes, thank you.

## 2018-06-19 NOTE — Telephone Encounter (Signed)
Patient son states that we were suppose to call him in a new medication to the [facility]

## 2018-06-19 NOTE — Telephone Encounter (Signed)
Donepezil 10mg  #30 with 5 refills Sig  = take half tab QD for 2 weeks then increase to 1 full tab QD  Sent to [facility]

## 2018-07-11 ENCOUNTER — Encounter

## 2018-07-11 ENCOUNTER — Ambulatory Visit: Payer: Medicare HMO | Admitting: Neurology

## 2018-11-12 ENCOUNTER — Other Ambulatory Visit: Payer: Self-pay

## 2018-11-12 MED ORDER — DONEPEZIL HCL 10 MG PO TABS: ORAL_TABLET | ORAL | 5 refills | Status: DC

## 2018-11-12 MED ORDER — QUETIAPINE FUMARATE 25 MG PO TABS: ORAL_TABLET | ORAL | 6 refills | Status: DC

## 2018-11-14 ENCOUNTER — Other Ambulatory Visit: Payer: Self-pay

## 2018-11-14 MED ORDER — QUETIAPINE FUMARATE 25 MG PO TABS: ORAL_TABLET | ORAL | 6 refills | Status: DC

## 2018-11-14 MED ORDER — DONEPEZIL HCL 10 MG PO TABS: ORAL_TABLET | ORAL | 5 refills | Status: DC

## 2018-12-06 ENCOUNTER — Ambulatory Visit: Payer: Medicare HMO | Admitting: Neurology

## 2018-12-12 ENCOUNTER — Other Ambulatory Visit: Payer: Self-pay

## 2018-12-12 ENCOUNTER — Encounter: Payer: Self-pay | Admitting: Neurology

## 2018-12-12 ENCOUNTER — Telehealth: Payer: Self-pay

## 2018-12-12 ENCOUNTER — Ambulatory Visit (INDEPENDENT_AMBULATORY_CARE_PROVIDER_SITE_OTHER): Payer: Medicare HMO | Admitting: Neurology

## 2018-12-12 VITALS — BP 180/88 | HR 101 | Temp 98.5°F | Ht 70.0 in | Wt 175.0 lb

## 2018-12-12 DIAGNOSIS — F03B18 Unspecified dementia, moderate, with other behavioral disturbance: Secondary | ICD-10-CM

## 2018-12-12 DIAGNOSIS — F0391 Unspecified dementia with behavioral disturbance: Secondary | ICD-10-CM | POA: Diagnosis not present

## 2018-12-12 MED ORDER — DONEPEZIL HCL 10 MG PO TABS: ORAL_TABLET | ORAL | 3 refills | Status: DC

## 2018-12-12 MED ORDER — QUETIAPINE FUMARATE 25 MG PO TABS: ORAL_TABLET | ORAL | 3 refills | Status: DC

## 2018-12-12 NOTE — Patient Instructions (Addendum)
1. Continue Donepezil 10mg  every night and Seroquel 25mg  every night  2. We will get you connected with [employer] through the [employer] to help with resources for more help at home  3. Follow-up in 6 months or so, call for any changes  FALL PRECAUTIONS: Be cautious when walking. Scan the area for obstacles that may increase the risk of trips and falls. When getting up in the mornings, sit up at the edge of the bed for a few minutes before getting out of bed. Consider elevating the bed at the head end to avoid drop of blood pressure when getting up. Walk always in a well-lit room (use night lights in the walls). Avoid area rugs or power cords from appliances in the middle of the walkways. Use a walker or a cane if necessary and consider physical therapy for balance exercise. Get your eyesight checked regularly.  HOME SAFETY: Consider the safety of the kitchen when operating appliances like stoves, microwave oven, and blender. Consider having supervision and share cooking responsibilities until no longer able to participate in those. Accidents with firearms and other hazards in the house should be identified and addressed as well.  ABILITY TO BE LEFT ALONE: If patient is unable to contact 911 operator, consider using LifeLine, or when the need is there, arrange for someone to stay with patients. Smoking is a fire hazard, consider supervision or cessation. Risk of wandering should be assessed by caregiver and if detected at any point, supervision and safe proof recommendations should be instituted.   RECOMMENDATIONS FOR ALL PATIENTS WITH MEMORY PROBLEMS: 1. Continue to exercise (Recommend 30 minutes of walking everyday, or 3 hours every week) 2. Increase social interactions - continue going to [place] and enjoy social gatherings with friends and family 3. Eat healthy, avoid fried foods and eat more fruits and vegetables 4. Maintain adequate blood pressure, blood sugar, and blood  cholesterol level. Reducing the risk of stroke and cardiovascular disease also helps promoting better memory. 5. Avoid stressful situations. Live a simple life and avoid aggravations. Organize your time and prepare for the next day in anticipation. 6. Sleep well, avoid any interruptions of sleep and avoid any distractions in the bedroom that may interfere with adequate sleep quality 7. Avoid sugar, avoid sweets as there is a strong link between excessive sugar intake, diabetes, and cognitive impairment The Mediterranean diet has been shown to help patients reduce the risk of progressive memory disorders and reduces cardiovascular risk. This includes eating fish, eat fruits and green leafy vegetables, nuts like almonds and hazelnuts, walnuts, and also use olive oil. Avoid fast foods and fried foods as much as possible. Avoid sweets and sugar as sugar use has been linked to worsening of memory function.  There is always a concern of gradual progression of memory problems. If this is the case, then we may need to adjust level of care according to patient needs. Support, both to the patient and caregiver, should then be put into place.

## 2018-12-12 NOTE — Telephone Encounter (Signed)
Faxed [employer] pts demos and ins card

## 2018-12-12 NOTE — Progress Notes (Signed)
NEUROLOGY FOLLOW UP OFFICE NOTE  Craig Sims [id number] 07/31/1940  HISTORY OF PRESENT ILLNESS: I had the pleasure of seeing Craig Sims in follow-up in the neurology clinic on 12/12/2018.  The patient was last seen 8 months ago for moderate dementia with behavioral disturbance (paranoia and hallucinations). He is accompanied by his son who helps supplement the history today. On his initial visit, he was started on Seroquel 25mg  qhs. He was started on Donepezil 10mg  qhs in January. His son reports that the Seroquel has helped significantly, he no longer has any paranoia or visual hallucinations and is sleeping better at night. He is alone during the day, his sons stay with him at night. Family provides meals, manages his medications and finances. He does not drive. He has occasional headaches and dizziness. He denies any tremors, no falls.   History on Initial Assessment 04/25/2018: This is a 78 year old right-handed man with a history of hypertension, hyperlipidemia, diabetes, presenting for evaluation of dementia. He feels his memory is "fair, can't remember like before." He lives alone. His sons started noticing changes around a year ago, they would have conversations and he would drift off to a different topic. His sons report that over the past 6 months, he would be awake at night turning all the lights with visual hallucinations. Last night he saw a little child in a corner with red shoes on. Hallucinations are mostly in the evening, they report he is okay during the daytime. He is easily agitated. He started having paranoia 2 months ago, thinking someone is trying to come in the house or someone is inside the house. He rarely drives but drove a week ago, denies getting lost. His son has always managed his bills. His sons have started fixing his pillbox and making sure he takes his medications. Sons have started staying with him for the past week due to the worsening hallucinations. His family  brought him to the ER yesterday due to worsening symptoms, he had a head CT without contrast which I personally reviewed, no acute changes, there was mild diffuse atrophy and chronic microvascular disease.  He has occasional left hand tremors. He has occasional headaches and dizziness when standing. He denies any diplopia, dysarthria/dysphagia, neck/back pain, focal numbness/tingling/weakness, bowel/bladder dysfunction. He has noticed reduced sense of smell. He naps during the day and gets fidgety at night, but does not wander outside the house. No family history of dementia. No history of significant head injuries or alcohol use.    PAST MEDICAL HISTORY: Past Medical History:  Diagnosis Date   BPH (benign prostatic hyperplasia)    Diabetes mellitus without complication (HCC)    High cholesterol    Hypertension     MEDICATIONS:  Outpatient Encounter Medications as of 12/12/2018  Medication Sig   atorvastatin (LIPITOR) 40 MG tablet Take 20 mg by mouth daily.    Cholecalciferol (VITAMIN D3) 50 MCG (2000 UT) TABS Take 2,000 Units by mouth 2 (two) times daily.   donepezil (ARICEPT) 10 MG tablet Take 1 tablet every night   finasteride (PROSCAR) 5 MG tablet Take 5 mg by mouth daily.   furosemide (LASIX) 20 MG tablet Take 1 tablet (20 mg total) by mouth every other day as needed for fluid or edema.   lisinopril (PRINIVIL,ZESTRIL) 5 MG tablet Take 5 mg by mouth daily.   metFORMIN (GLUCOPHAGE) 500 MG tablet Take 500 mg by mouth daily with breakfast.   QUEtiapine (SEROQUEL) 25 MG tablet Take 1 tablet every  night.   tamsulosin (FLOMAX) 0.4 MG CAPS capsule Take 0.4 mg by mouth 2 (two) times daily.   No facility-administered encounter medications on file as of 12/12/2018.    ALLERGIES: No Known Allergies  FAMILY HISTORY: Family History  Problem Relation Age of Onset   Cancer Father     SOCIAL HISTORY: Social History   Socioeconomic History   Marital status: Single    Spouse  name: Not on file   Number of children: 3   Years of education: Not on file   Highest education level: Never attended school  Occupational History   Occupation: retired Engineer, manufacturing systems strain: Not on file   Food insecurity    Worry: Not on file    Inability: Not on Lexicographer needs    Medical: Not on file    Non-medical: Not on file  Tobacco Use   Smoking status: Never Smoker   Smokeless tobacco: Never Used  Substance and Sexual Activity   Alcohol use: No   Drug use: No   Sexual activity: Not on file  Lifestyle   Physical activity    Days per week: Not on file    Minutes per session: Not on file   Stress: Not on file  Relationships   Social connections    Talks on phone: Not on file    Gets together: Not on file    Attends religious service: Not on file    Active member of club or organization: Not on file    Attends meetings of clubs or organizations: Not on file    Relationship status: Not on file   Intimate partner violence    Fear of current or ex partner: Not on file    Emotionally abused: Not on file    Physically abused: Not on file    Forced sexual activity: Not on file  Other Topics Concern   Not on file  Social History Narrative   Lives alone in a one story home.  Has 3 children.  Retired Armed forces logistics/support/administrative officer.  Education: none.     REVIEW OF SYSTEMS: Constitutional: No fevers, chills, or sweats, no generalized fatigue, change in appetite Eyes: No visual changes, double vision, eye pain Ear, nose and throat: No hearing loss, ear pain, nasal congestion, sore throat Cardiovascular: No chest pain, palpitations Respiratory:  No shortness of breath at rest or with exertion, wheezes GastrointestinaI: No nausea, vomiting, diarrhea, abdominal pain, fecal incontinence Genitourinary:  No dysuria, urinary retention or frequency Musculoskeletal:  No neck pain, back pain Integumentary: No rash, pruritus, skin  lesions Neurological: as above Psychiatric: No depression, insomnia, anxiety Endocrine: No palpitations, fatigue, diaphoresis, mood swings, change in appetite, change in weight, increased thirst Hematologic/Lymphatic:  No anemia, purpura, petechiae. Allergic/Immunologic: no itchy/runny eyes, nasal congestion, recent allergic reactions, rashes  PHYSICAL EXAM: Vitals:   12/12/18 0924  BP: (!) 180/88  Pulse: (!) 101  Temp: 98.5 F (36.9 C)  SpO2: 97%   General: No acute distress, minimal verbal output but would answer questions and briefly smile when engaged Head:  Normocephalic/atraumatic Skin/Extremities: No rash, no edema Neurological Exam: alert and oriented to person, city/state, year. No aphasia or dysarthria. Fund of knowledge is reduced.  Recent and remote memory are impaired.  Attention and concentration are reduced.    Able to name objects, difficulty with repetition.  MMSE - Mini Mental State Exam 12/12/2018 04/25/2018  Orientation to time 2 3  Orientation to Place 3  5  Registration 3 3  Attention/ Calculation 0 0  Recall 1 0  Language- name 2 objects 2 2  Language- repeat 0 1  Language- follow 3 step command 3 3  Language- read & follow direction 0 0  Write a sentence 0 0  Copy design 0 0  Total score 14 17    Cranial nerves: Pupils equal, round, reactive to light. Extraocular movements intact with no nystagmus. Visual fields full. No facial asymmetry. Motor: Bulk and tone normal, no cogwheeling, muscle strength 5/5 throughout with no pronator drift. Good finger taps.  Sensation to light touch intact.  Finger to nose testing intact.  Gait narrow-based and steady.   IMPRESSION: This is a 78 yo RH man with a history of hypertension, hyperlipidemia, diabetes, with moderate dementia with behavioral disturbance. MMSE today 14/28 (17/28 in November 2019, patient illiterate). Head CT no acute changes. Hallucinations and paranoia improved with Seroquel 25mg  qhs. Continue Donepezil  10mg  qhs. Continue close home supervision. He does not drive. They will be connected with [employer] through the [employer] to help provide with local resources for more help at home. We again discussed the importance of control of vascular risk factors, physical exercise, and brain stimulation exercises for brain health. Follow-up in 6 months, they know to call for any changes.   Thank you for allowing me to participate in his care.  Please do not hesitate to call for any questions or concerns.  The duration of this appointment visit was 30 minutes of face-to-face time with the patient.  Greater than 50% of this time was spent in counseling, explanation of diagnosis, planning of further management, and coordination of care.   Patrcia DollyKaren Caria Transue, M.D.   CC: Dr. Bryna ColanderKikel

## 2019-01-01 ENCOUNTER — Telehealth: Payer: Self-pay | Admitting: Neurology

## 2019-01-01 NOTE — Telephone Encounter (Signed)
FMLA paperwork; son was calling in wanting to check the status of this. Thanks!

## 2019-01-02 NOTE — Telephone Encounter (Signed)
Left message for son to return call.  Need to know if son requires intermittent leave from his employment. If so, what is the time frame. 1-2 weeks? A month?  Son was instructed to come by this afternoon for completed paperwork, but that was before Dr. Delice Lesch needed to know about intermittent leave.

## 2019-01-03 NOTE — Telephone Encounter (Signed)
Son came by yesterday afternoon. He states that he works Haematologist by hours. He is allowed up to 150 per year. Forms were completed and signed by Dr. Delice Lesch and pt's son. Copies made to be scanned and originals given to son for him to take to his employer.

## 2019-07-16 ENCOUNTER — Encounter: Payer: Self-pay | Admitting: Neurology

## 2019-07-16 ENCOUNTER — Other Ambulatory Visit: Payer: Self-pay

## 2019-07-16 ENCOUNTER — Telehealth (INDEPENDENT_AMBULATORY_CARE_PROVIDER_SITE_OTHER): Payer: Medicare HMO | Admitting: Neurology

## 2019-07-16 VITALS — Ht 70.0 in | Wt 175.0 lb

## 2019-07-16 DIAGNOSIS — F0391 Unspecified dementia with behavioral disturbance: Secondary | ICD-10-CM

## 2019-07-16 DIAGNOSIS — F03B18 Unspecified dementia, moderate, with other behavioral disturbance: Secondary | ICD-10-CM

## 2019-07-16 MED ORDER — DONEPEZIL HCL 10 MG PO TABS: ORAL_TABLET | ORAL | 3 refills | Status: DC

## 2019-07-16 MED ORDER — QUETIAPINE FUMARATE 25 MG PO TABS: ORAL_TABLET | ORAL | 3 refills | Status: DC

## 2019-07-16 MED ORDER — MEMANTINE HCL 10 MG PO TABS: ORAL_TABLET | ORAL | 11 refills | Status: DC

## 2019-07-16 NOTE — Progress Notes (Signed)
Virtual Visit via Video Note The purpose of this virtual visit is to provide medical care while limiting exposure to the novel coronavirus.    Consent was obtained for video visit:  Yes.   Answered questions that patient had about telehealth interaction:  Yes.   I discussed the limitations, risks, security and privacy concerns of performing an evaluation and management service by telemedicine. I also discussed with the patient that there may be a patient responsible charge related to this service. The patient expressed understanding and agreed to proceed.  Pt location: Home Physician Location: office Name of referring provider:  Smith Robert, MD I connected with Craig Sims at patients initiation/request on 07/16/2019 at 10:00 AM EST by video enabled telemedicine application and verified that I am speaking with the correct person using two identifiers. Pt MRN:  [id number] Pt DOB:  1941/01/03 Video Participants:  Craig Sims;  Ali Lowe (son)   History of Present Illness:  The patient had a virtual video visit on 07/16/2019. 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 His son Fayrene Fearing is present during the e-visit to provide additional information. MMSE 14/28 (patient illiterate) in July 2020. He is on Donepezil 10mg  qhs and Quetiapine 25mg  qhs without side effects. Since his last visit, reports that he has occasional paranoia and hallucinations. For a time it was quite often, but is has quieted down. He is alone during the day, James stays with him at night. He does not drive. James manages medications, finances, and meals. The patient admits to seeing things, such as someone dancing, which is not scary to him. He states his memory comes and goes. reports sometimes he does not sleep well. No wandering behavior. Appetite good. He has occasional headaches and dizziness. He denies any focal  symptoms. No tremors, no falls.   History on Initial Assessment 04/25/2018: This is a 79 year old right-handed man with a history of hypertension, hyperlipidemia, diabetes, presenting for evaluation of dementia. He feels his memory is "fair, can't remember like before." He lives alone. His sons started noticing changes around a year ago, they would have conversations and he would drift off to a different topic. His sons report that over the past 6 months, he would be awake at night turning all the lights with visual hallucinations. Last night he saw a little child in a corner with red shoes on. Hallucinations are mostly in the evening, they report he is okay during the daytime. He is easily agitated. He started having paranoia 2 months ago, thinking someone is trying to come in the house or someone is inside the house. He rarely drives but drove a week ago, denies getting lost. His son has always managed his bills. His sons have started fixing his pillbox and making sure he takes his medications. Sons have started staying with him for the past week due to the worsening hallucinations. His family brought him to the ER yesterday due to worsening symptoms, he had a head CT without contrast which I personally reviewed, no acute changes, there was mild diffuse atrophy and chronic microvascular disease.  He has occasional left hand tremors. He has occasional headaches and dizziness when standing. He denies any diplopia, dysarthria/dysphagia, neck/back pain, focal numbness/tingling/weakness, bowel/bladder dysfunction. He has noticed reduced sense of smell. He naps during the day and gets fidgety at night, but does not wander outside the house. No family history of  dementia. No history of significant head injuries or alcohol use.    Outpatient Encounter Medications as of 07/16/2019  Medication Sig  . atorvastatin (LIPITOR) 40 MG tablet Take 20 mg by mouth daily.   . Cholecalciferol (VITAMIN D3) 50 MCG (2000 UT) TABS  Take 2,000 Units by mouth 2 (two) times daily.  Marland Kitchen donepezil (ARICEPT) 10 MG tablet Take 1 tablet every night  . finasteride (PROSCAR) 5 MG tablet Take 5 mg by mouth daily.  Marland Kitchen lisinopril (PRINIVIL,ZESTRIL) 5 MG tablet Take 5 mg by mouth daily.  . metFORMIN (GLUCOPHAGE) 500 MG tablet Take 500 mg by mouth daily with breakfast.  . QUEtiapine (SEROQUEL) 25 MG tablet Take 1 tablet every night.  . tamsulosin (FLOMAX) 0.4 MG CAPS capsule Take 0.4 mg by mouth 2 (two) times daily.  .    .    .    .     No facility-administered encounter medications on file as of 07/16/2019.    Observations/Objective:   Vitals:   07/16/19 0937  Weight: 175 lb (79.4 kg)  Height: 5\' 10"  (1.778 m)   GEN:  The patient appears stated age and is in NAD.  Neurological examination: Patient is awake, alert, oriented to person, place, month, day of week. Year is 2020. No aphasia or dysarthria. Reduced fluency, able to follow commands. Remote and recent memory impaired, 0/3 delayed recall. Cranial nerves: Extraocular movements intact with no nystagmus. No facial asymmetry. Motor: moves all extremities symmetrically, at least anti-gravity x 4. No incoordination on finger to nose testing. Gait: slow and cautious, no ataxia. No tremor, good finger taps.   Assessment and Plan:   This is a 79 yo RH man with a history of hypertension, hyperlipidemia, diabetes, with moderate dementia with behavioral disturbance. MMSE 14/28 in July 2020 (17/28 in November 2019, patient illiterate). Head CT no acute changes. Hallucinations and paranoia occur occasionally. We discussed adding on Memantine, side effects discussed. Start Memantine 10mg  qhs x 2 weeks, then increase to 10mg  BID. Continue Donepezil 10mg  qhs and Quetiapine 25mg  qhs. If no improvement, may increase Quetiapine to 50mg  qhs for hallucinations in the future. Continue close supervision. He does not drive. Follow-up in 6 months, they know to call for any changes.    Follow Up  Instructions:   -I discussed the assessment and treatment plan with the patient/son. The patient/son were provided an opportunity to ask questions and all were answered. The patient/son agreed with the plan and demonstrated an understanding of the instructions.   The patient/son was advised to call back or seek an in-person evaluation if the symptoms worsen or if the condition fails to improve as anticipated.    Cameron Sprang, MD

## 2019-07-18 ENCOUNTER — Telehealth: Payer: Medicare HMO | Admitting: Neurology

## 2019-08-16 ENCOUNTER — Other Ambulatory Visit: Payer: Self-pay

## 2019-08-16 ENCOUNTER — Telehealth: Payer: Self-pay | Admitting: Neurology

## 2019-08-16 MED ORDER — MEMANTINE HCL 10 MG PO TABS: ORAL_TABLET | ORAL | 11 refills | Status: DC

## 2019-08-16 NOTE — Telephone Encounter (Signed)
Script sent to mail order pharmacy

## 2019-08-16 NOTE — Telephone Encounter (Signed)
Patient's son called in regarding his Memantine medication. He said that the medication is working well and would like it called into his mail order pharmacy [facility]. Thank you

## 2020-01-28 ENCOUNTER — Ambulatory Visit: Payer: Medicare HMO | Admitting: Neurology

## 2020-01-29 ENCOUNTER — Ambulatory Visit: Payer: Medicare HMO | Admitting: Neurology

## 2020-05-14 ENCOUNTER — Telehealth: Payer: Self-pay | Admitting: Neurology

## 2020-05-14 NOTE — Telephone Encounter (Signed)
Patient's son called in stating the patient has been having some memory problems that seem to be getting worse. The patient stated "he can't remember anything". The medicine had been working, but it seems not to be anymore.

## 2020-05-14 NOTE — Telephone Encounter (Signed)
Pt son stated that his dad has increased confusion, pt stated he is not remembering anything and he is getting agitated and frustrated. Pt is taken aricept 10 mg daily namenda 10 mg BID and seroquel 25 mg daily

## 2020-05-14 NOTE — Telephone Encounter (Signed)
Called pt son to get more information about what was going on with Mr Craig Sims no answer left a voice mail for him to call me back

## 2020-05-15 NOTE — Telephone Encounter (Signed)
Unfortunately, dementia is progressive.  It is important for the patient and his son to understand that the memory is expected to get worse.  There is no medication that will stop the progression of memory problems.  The donepezil and memantine are primarily used to slow progression but will become less effective with time.

## 2020-05-15 NOTE — Telephone Encounter (Signed)
Pt son called no answer left a voice mail to call the office back  

## 2020-05-18 NOTE — Telephone Encounter (Signed)
Agree, would try behavioral/non-pharmacologic management first. Thanks

## 2020-05-18 NOTE — Telephone Encounter (Signed)
spoke with pt son informed him that Unfortunately, dementia is progressive.  It is important for the patient and his son to understand that the memory is expected to get worse.  There is no medication that will stop the progression of memory problems.  The donepezil and memantine are primarily used to slow progression but will become less effective with time. Will mail out a [place] Si folder to pt son to address  On file if  Dr Karel Jarvis has any other recommendation will call pt son back

## 2020-07-22 ENCOUNTER — Other Ambulatory Visit: Payer: Self-pay | Admitting: Neurology

## 2020-08-01 IMAGING — CT CT HEAD W/O CM
4 series · 15 of 47 positions shown, 17 images · non-contrast
Comparison: No priors.

CLINICAL DATA: 77-year-old male with history of loss of
consciousness.

EXAM:
CT HEAD WITHOUT CONTRAST
TECHNIQUE: Contiguous axial images were obtained from the base of the skull
through the vertex without intravenous contrast.

[Series 3: head without · axial · non-contrast · 0.50mm/px · z∈[-122,+8]mm · 7 of 36 slices shown, 9 images]
[im 5/36  brain]
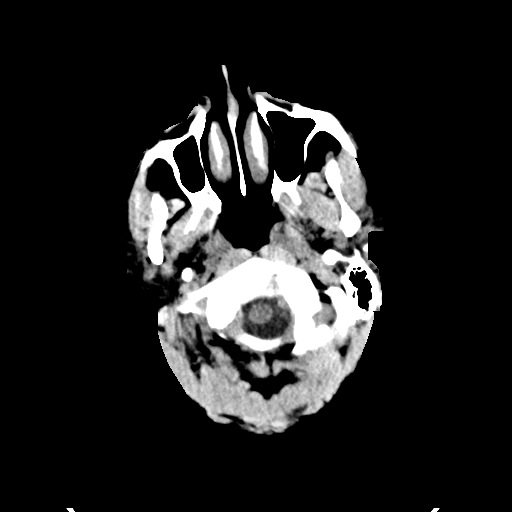
[im 5/36  bone]
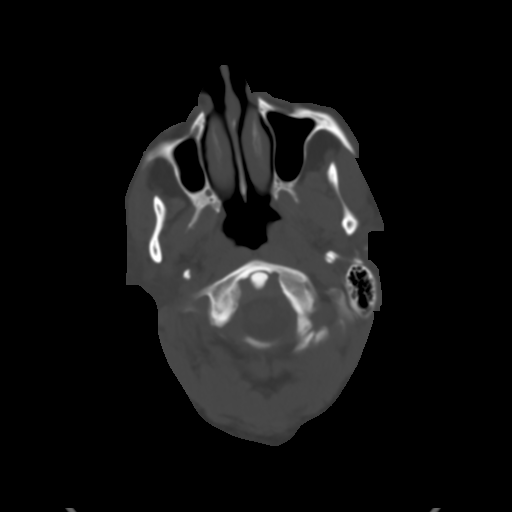
[im 9/36  brain]
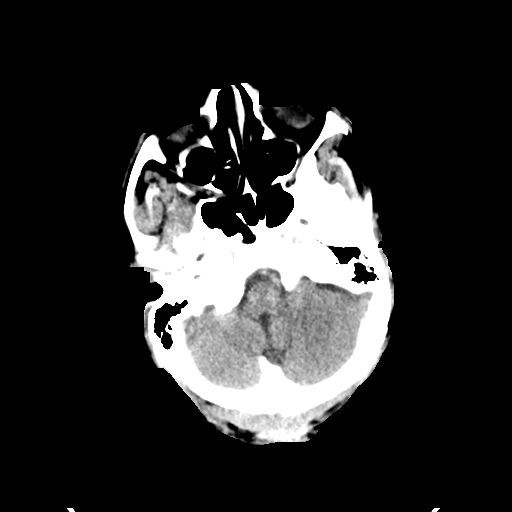
[im 14/36  brain]
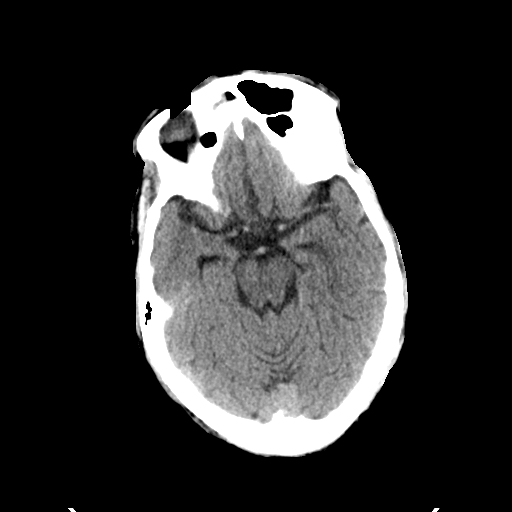
[im 18/36  brain]
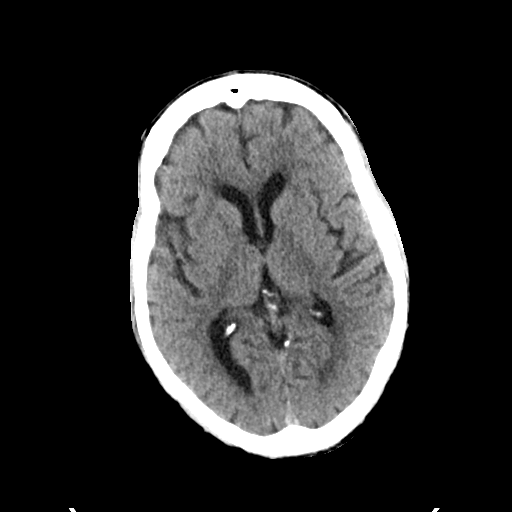
[im 22/36  brain]
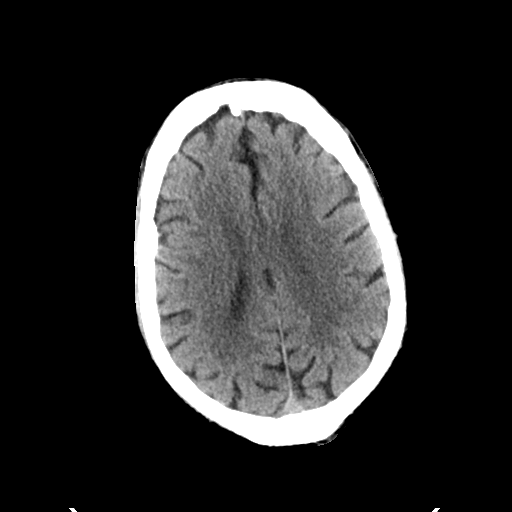
[im 22/36  bone]
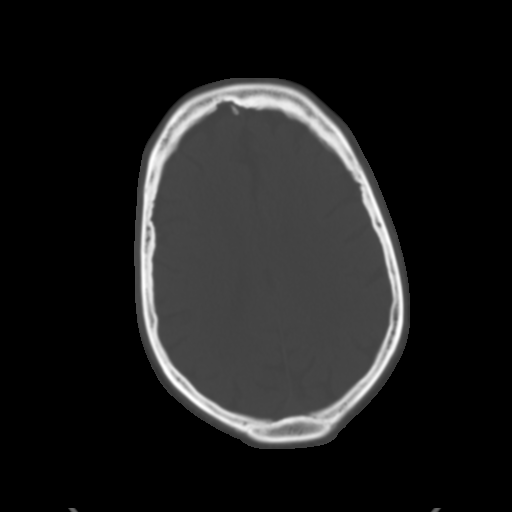
[im 27/36  brain]
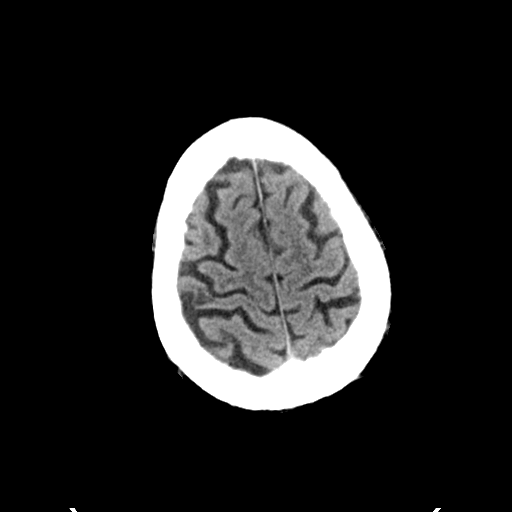
[im 31/36  brain]
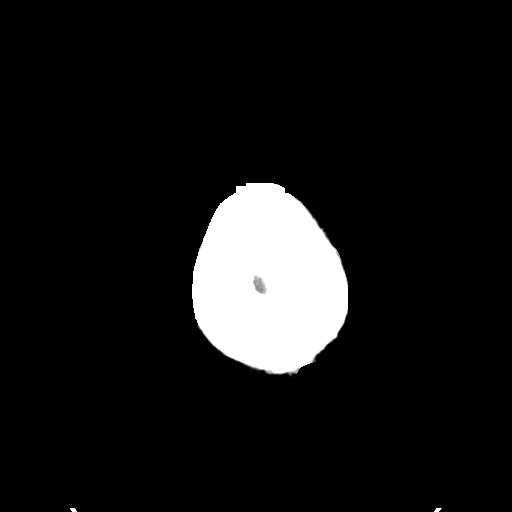

[Series 4: head bone · axial · 0.50mm/px · z∈[-126,-108]mm · 2 of 90 slices shown]
[im 9/90  bone]
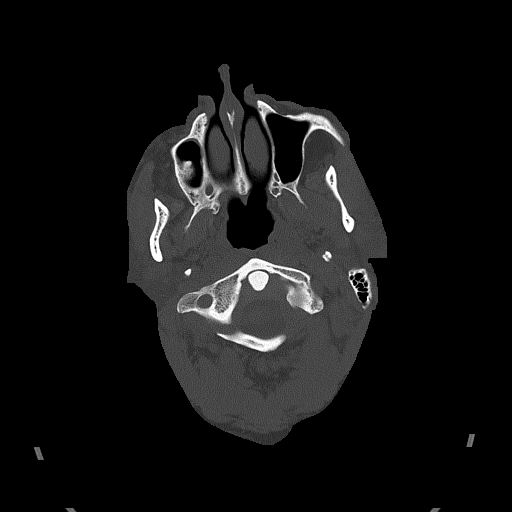
[im 18/90  bone]
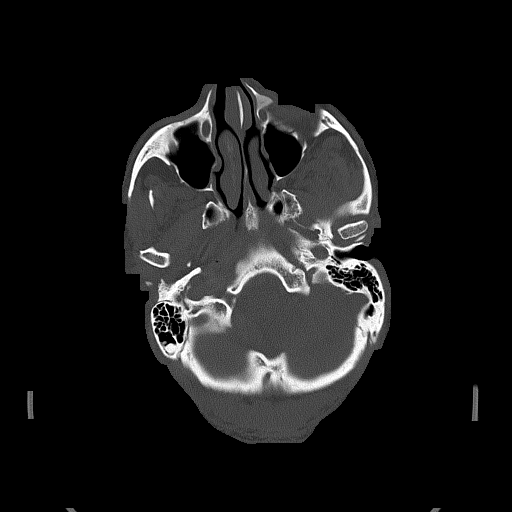

[Series 5: head without cor · coronal · non-contrast · 0.35mm/px · 3 of 75 slices shown]
[im 25/75  brain]
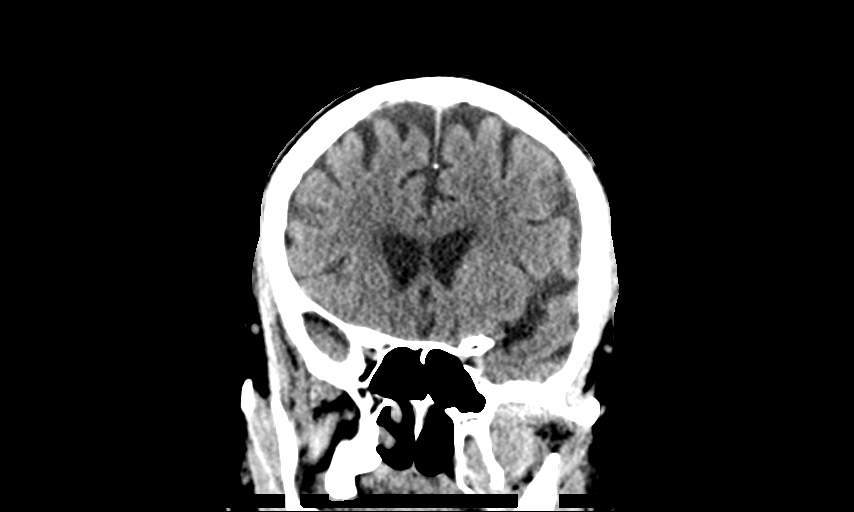
[im 33/75  brain]
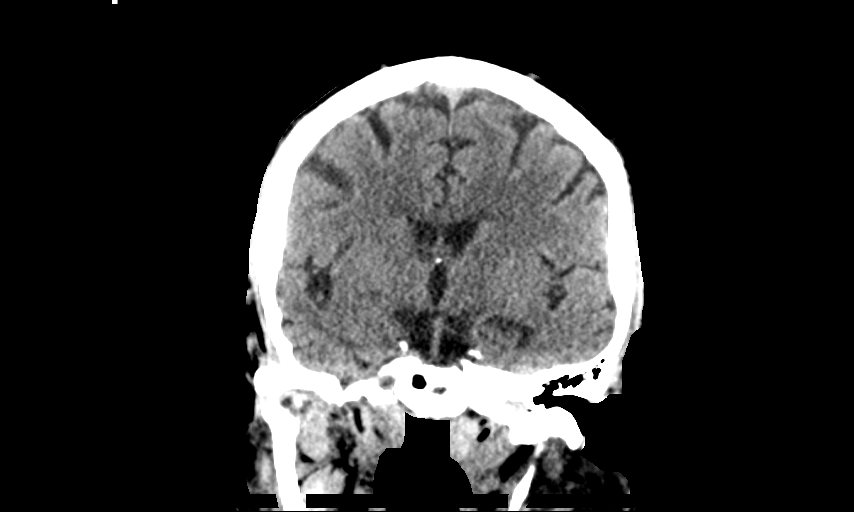
[im 42/75  brain]
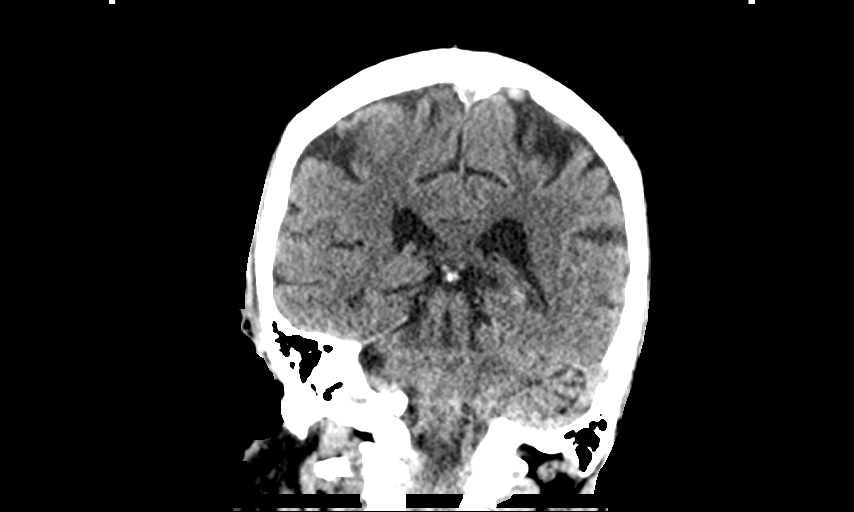

[Series 6: head without sag · sagittal · non-contrast · 0.45mm/px · 3 of 67 slices shown]
[im 23/67  brain]
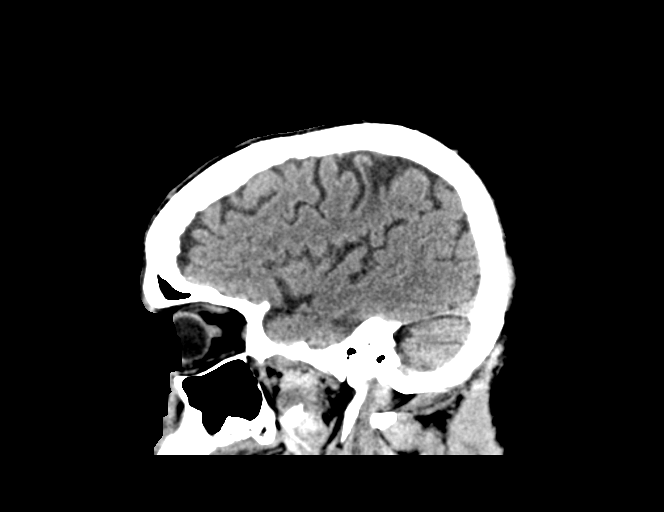
[im 34/67  brain]
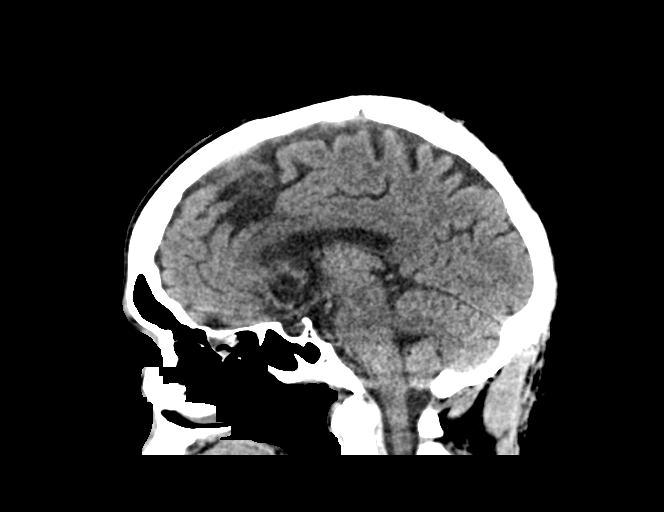
[im 45/67  brain]
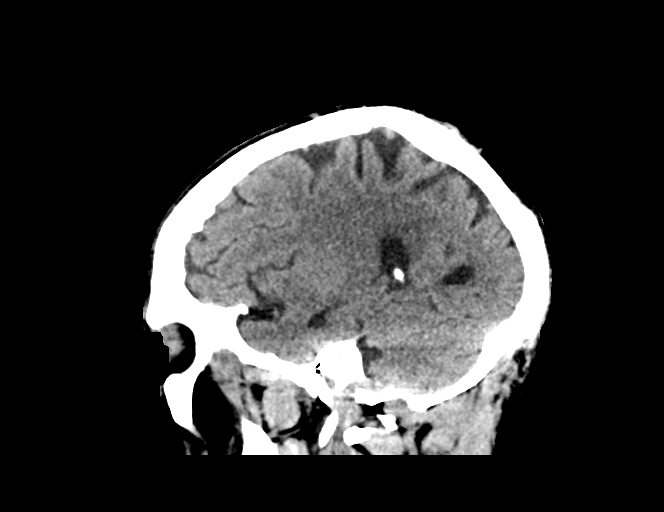

[15 of 47 positions shown; findings below may reference images not displayed]

FINDINGS: Brain: Mild cerebral atrophy. Patchy and confluent areas of
decreased attenuation are noted throughout the deep and
periventricular white matter of the cerebral hemispheres
bilaterally, compatible with chronic microvascular ischemic disease.
No evidence of acute infarction, hemorrhage, hydrocephalus,
extra-axial collection or mass lesion/mass effect.

Vascular: No hyperdense vessel or unexpected calcification.

Skull: Normal. Negative for fracture or focal lesion.

Sinuses/Orbits: No acute finding.

Other: None.
IMPRESSION: 1. No acute intracranial abnormalities.
2. Mild cerebral atrophy with chronic microvascular ischemic changes
in the cerebral white matter, as above.

## 2020-10-05 ENCOUNTER — Other Ambulatory Visit: Payer: Self-pay | Admitting: Neurology

## 2020-10-09 ENCOUNTER — Other Ambulatory Visit: Payer: Self-pay | Admitting: Neurology

## 2020-10-21 ENCOUNTER — Encounter: Payer: Self-pay | Admitting: Neurology

## 2021-01-12 ENCOUNTER — Ambulatory Visit: Payer: Medicare HMO | Admitting: Neurology

## 2021-01-12 ENCOUNTER — Encounter: Payer: Self-pay | Admitting: Neurology

## 2021-01-12 ENCOUNTER — Other Ambulatory Visit: Payer: Self-pay

## 2021-01-12 VITALS — BP 129/79 | HR 104 | Ht 69.0 in | Wt 164.0 lb

## 2021-01-12 DIAGNOSIS — F0391 Unspecified dementia with behavioral disturbance: Secondary | ICD-10-CM

## 2021-01-12 DIAGNOSIS — F03B18 Unspecified dementia, moderate, with other behavioral disturbance: Secondary | ICD-10-CM

## 2021-01-12 MED ORDER — SERTRALINE HCL 25 MG PO TABS: 25.0000 mg | ORAL_TABLET | Freq: Every day | ORAL | 6 refills | Status: DC

## 2021-01-12 MED ORDER — MEMANTINE HCL 10 MG PO TABS: ORAL_TABLET | ORAL | 3 refills | Status: DC

## 2021-01-12 MED ORDER — QUETIAPINE FUMARATE 25 MG PO TABS: ORAL_TABLET | ORAL | 3 refills | Status: DC

## 2021-01-12 MED ORDER — DONEPEZIL HCL 10 MG PO TABS: ORAL_TABLET | ORAL | 3 refills | Status: DC

## 2021-01-12 NOTE — Progress Notes (Signed)
NEUROLOGY FOLLOW UP OFFICE NOTE  ELMER MERWIN [id number] 1977/05/13  HISTORY OF PRESENT ILLNESS: I had the pleasure of seeing Pauline Trainer in follow-up in the neurology clinic on 01/12/2021.  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 He is again accompanied by his son Fayrene Fearing who helps supplement the history today.  Records and images were personally reviewed where available.  Since his last visit, he repots his memory is getting worse. Fayrene Fearing reports this has caused increased frustration. When he looks at numbers and clocks, he gets really agitated. Fayrene Fearing and his brother rotate to stay with him at night. He is alone during the daytime, but his brother lives next door. Family manages medications, finances, meals. He does not drive. He is independent with dressing and bathing. Sleep is up and down, some nights Fayrene Fearing hears him snoring, other nights he is up walking around and talking, seeing people, no auditory component. These are not threatening to him, he has gotten used to it. He has occasional headaches, dizziness, no falls. Appetite is good. He mostly stays at home on his recliner, refusing to go out or join any day programs/senior center activities. He is on Donepezil 10mg  daily, Memantine 10mg  BID, and Seroquel 25mg  qhs without side effects.  History on Initial Assessment 04/25/2018: This is a 80 year old right-handed man with a history of hypertension, hyperlipidemia, diabetes, presenting for evaluation of dementia. He feels his memory is "fair, can't remember like before." He lives alone. His sons started noticing changes around a year ago, they would have conversations and he would drift off to a different topic. His sons report that over the past 6 months, he would be awake at night turning all the lights with visual hallucinations. Last night he saw a little child in a corner with red shoes on. Hallucinations are  mostly in the evening, they report he is okay during the daytime. He is easily agitated. He started having paranoia 2 months ago, thinking someone is trying to come in the house or someone is inside the house. He rarely drives but drove a week ago, denies getting lost. His son has always managed his bills. His sons have started fixing his pillbox and making sure he takes his medications. Sons have started staying with him for the past week due to the worsening hallucinations. His family brought him to the ER yesterday due to worsening symptoms, he had a head CT without contrast which I personally reviewed, no acute changes, there was mild diffuse atrophy and chronic microvascular disease.  He has occasional left hand tremors. He has occasional headaches and dizziness when standing. He denies any diplopia, dysarthria/dysphagia, neck/back pain, focal numbness/tingling/weakness, bowel/bladder dysfunction. He has noticed reduced sense of smell. He naps during the day and gets fidgety at night, but does not wander outside the house. No family history of dementia. No history of significant head injuries or alcohol use.    PAST MEDICAL HISTORY: Past Medical History:  Diagnosis Date   BPH (benign prostatic hyperplasia)    Diabetes mellitus without complication (HCC)    High cholesterol    Hypertension     MEDICATIONS: Current Outpatient Medications on File Prior to Visit  Medication Sig Dispense Refill   atorvastatin (LIPITOR) 40 MG tablet Take 20 mg by mouth daily.      Cholecalciferol (VITAMIN D3) 50 MCG (2000 UT) TABS Take 2,000 Units by mouth 2 (two) times daily.  5  donepezil (ARICEPT) 10 MG tablet TAKE 1 TABLET EVERY NIGHT 90 tablet 0   finasteride (PROSCAR) 5 MG tablet Take 5 mg by mouth daily.  11   furosemide (LASIX) 20 MG tablet Take 1 tablet (20 mg total) by mouth every other day as needed for fluid or edema. 30 tablet    lisinopril (PRINIVIL,ZESTRIL) 5 MG tablet Take 5 mg by mouth daily.      memantine (NAMENDA) 10 MG tablet TAKE 1 TABLET TWICE DAILY 180 tablet 0   metFORMIN (GLUCOPHAGE) 500 MG tablet Take 500 mg by mouth daily with breakfast.     QUEtiapine (SEROQUEL) 25 MG tablet TAKE 1 TABLET EVERY NIGHT. 90 tablet 0   tamsulosin (FLOMAX) 0.4 MG CAPS capsule Take 0.4 mg by mouth 2 (two) times daily.  11   No current facility-administered medications on file prior to visit.    ALLERGIES: No Known Allergies  FAMILY HISTORY: Family History  Problem Relation Age of Onset   Cancer Father     SOCIAL HISTORY: Social History   Socioeconomic History   Marital status: Single    Spouse name: Not on file   Number of children: 3   Years of education: Not on file   Highest education level: Never attended school  Occupational History   Occupation: retired Education officer, environmental  Tobacco Use   Smoking status: Never   Smokeless tobacco: Never  Building services engineer Use: Never used  Substance and Sexual Activity   Alcohol use: No   Drug use: No   Sexual activity: Not on file  Other Topics Concern   Not on file  Social History Narrative   Lives alone in a one story home.  Has 3 children.  Retired Education officer, environmental.  Education: none   Right handed   Caffeine yes.    Social Determinants of Health   Financial Resource Strain: Not on file  Food Insecurity: Not on file  Transportation Needs: Not on file  Physical Activity: Not on file  Stress: Not on file  Social Connections: Not on file  Intimate Partner Violence: Not on file     PHYSICAL EXAM: Vitals:   01/12/21 0836  BP: 129/79  Pulse: (!) 104  SpO2: 99%   General: No acute distress,poor eye contact Head:  Normocephalic/atraumatic Skin/Extremities: No rash, no edema Neurological Exam: alert and oriented to person, place. Year is 2000, did not know month. No aphasia or dysarthria. Fund of knowledge is appropriate.  Recent and remote memory are impaired, 0/3 immediate recall. Attention and concentration are reduced. Cranial  nerves: Pupils equal, round. Extraocular movements intact with no nystagmus. Visual fields full.  No facial asymmetry.  Motor: Bulk and tone normal, muscle strength 5/5 throughout with no pronator drift.   Finger to nose testing intact.  Gait slightly hunched, narrow-based and steady, no ataxia. No tremors   IMPRESSION: This is a 80 yo RH man with a history of hypertension, hyperlipidemia, diabetes, with moderate dementia with behavioral disturbance. Head CT no acute changes. He and his son report continued cognitive decline, continue Donepezil 10mg  daily and Memantine 10mg  BID. We discussed starting an SSRI to help with mood, start Sertraline 25mg  daily, side effects discussed. May uptitrate as tolerated. He was encouraged to join day programs/senior center activities. Continue close supervision. He does not drive. Follow-up in 6 months, call for any changes.    Thank you for allowing me to participate in his care.  Please do not hesitate to call for any questions  or concerns.   Patrcia Dolly, M.D.   CC: Dr. Bryna Colander

## 2021-01-12 NOTE — Patient Instructions (Signed)
Start Sertraline 25mg  daily  2. Continue all your other medications  3. Recommend joining your senior center or , or joining day programs such as [facility]  4. Follow-up in 6 months, call for any changes   FALL PRECAUTIONS: Be cautious when walking. Scan the area for obstacles that may increase the risk of trips and falls. When getting up in the mornings, sit up at the edge of the bed for a few minutes before getting out of bed. Consider elevating the bed at the head end to avoid drop of blood pressure when getting up. Walk always in a well-lit room (use night lights in the walls). Avoid area rugs or power cords from appliances in the middle of the walkways. Use a walker or a cane if necessary and consider physical therapy for balance exercise. Get your eyesight checked regularly.   HOME SAFETY: Consider the safety of the kitchen when operating appliances like stoves, microwave oven, and blender. Consider having supervision and share cooking responsibilities until no longer able to participate in those. Accidents with firearms and other hazards in the house should be identified and addressed as well.  ABILITY TO BE LEFT ALONE: If patient is unable to contact 911 operator, consider using LifeLine, or when the need is there, arrange for someone to stay with patients. Smoking is a fire hazard, consider supervision or cessation. Risk of wandering should be assessed by caregiver and if detected at any point, supervision and safe proof recommendations should be instituted.   RECOMMENDATIONS FOR ALL PATIENTS WITH MEMORY PROBLEMS: 1. Continue to exercise (Recommend 30 minutes of walking everyday, or 3 hours every week) 2. Increase social interactions - continue going to [place] and enjoy social gatherings with friends and family 3. Eat healthy, avoid fried foods and eat more fruits and vegetables 4. Maintain adequate blood pressure, blood sugar, and blood cholesterol level. Reducing the risk  of stroke and cardiovascular disease also helps promoting better memory. 5. Avoid stressful situations. Live a simple life and avoid aggravations. Organize your time and prepare for the next day in anticipation. 6. Sleep well, avoid any interruptions of sleep and avoid any distractions in the bedroom that may interfere with adequate sleep quality 7. Avoid sugar, avoid sweets as there is a strong link between excessive sugar intake, diabetes, and cognitive impairment The Mediterranean diet has been shown to help patients reduce the risk of progressive memory disorders and reduces cardiovascular risk. This includes eating fish, eat fruits and green leafy vegetables, nuts like almonds and hazelnuts, walnuts, and also use olive oil. Avoid fast foods and fried foods as much as possible. Avoid sweets and sugar as sugar use has been linked to worsening of memory function.  There is always a concern of gradual progression of memory problems. If this is the case, then we may need to adjust level of care according to patient needs. Support, both to the patient and caregiver, should then be put into place.

## 2021-01-22 ENCOUNTER — Ambulatory Visit: Payer: Medicare HMO | Admitting: Neurology

## 2021-04-21 ENCOUNTER — Other Ambulatory Visit: Payer: Self-pay

## 2021-04-21 MED ORDER — QUETIAPINE FUMARATE 25 MG PO TABS: ORAL_TABLET | ORAL | 0 refills | Status: DC

## 2021-04-26 ENCOUNTER — Other Ambulatory Visit: Payer: Self-pay

## 2021-04-26 ENCOUNTER — Encounter (HOSPITAL_COMMUNITY): Payer: Self-pay

## 2021-04-26 ENCOUNTER — Telehealth: Payer: Self-pay | Admitting: Neurology

## 2021-04-26 ENCOUNTER — Observation Stay (HOSPITAL_COMMUNITY)
Admission: EM | Admit: 2021-04-26 | Discharge: 2021-04-27 | Disposition: A | Discharge status: Home / Self Care | Payer: Medicare HMO | Attending: Internal Medicine | Admitting: Internal Medicine

## 2021-04-26 DIAGNOSIS — E11649 Type 2 diabetes mellitus with hypoglycemia without coma: Principal | ICD-10-CM | POA: Insufficient documentation

## 2021-04-26 DIAGNOSIS — I129 Hypertensive chronic kidney disease with stage 1 through stage 4 chronic kidney disease, or unspecified chronic kidney disease: Secondary | ICD-10-CM | POA: Insufficient documentation

## 2021-04-26 DIAGNOSIS — Z7984 Long term (current) use of oral hypoglycemic drugs: Secondary | ICD-10-CM | POA: Insufficient documentation

## 2021-04-26 DIAGNOSIS — E162 Hypoglycemia, unspecified: Secondary | ICD-10-CM | POA: Diagnosis not present

## 2021-04-26 DIAGNOSIS — E118 Type 2 diabetes mellitus with unspecified complications: Secondary | ICD-10-CM

## 2021-04-26 DIAGNOSIS — Z20822 Contact with and (suspected) exposure to covid-19: Secondary | ICD-10-CM | POA: Insufficient documentation

## 2021-04-26 DIAGNOSIS — N1831 Chronic kidney disease, stage 3a: Secondary | ICD-10-CM | POA: Insufficient documentation

## 2021-04-26 DIAGNOSIS — E785 Hyperlipidemia, unspecified: Secondary | ICD-10-CM

## 2021-04-26 DIAGNOSIS — F0390 Unspecified dementia without behavioral disturbance: Secondary | ICD-10-CM | POA: Insufficient documentation

## 2021-04-26 DIAGNOSIS — F0150 Vascular dementia without behavioral disturbance: Secondary | ICD-10-CM

## 2021-04-26 DIAGNOSIS — N401 Enlarged prostate with lower urinary tract symptoms: Secondary | ICD-10-CM | POA: Diagnosis not present

## 2021-04-26 DIAGNOSIS — N183 Chronic kidney disease, stage 3 unspecified: Secondary | ICD-10-CM

## 2021-04-26 DIAGNOSIS — E119 Type 2 diabetes mellitus without complications: Secondary | ICD-10-CM

## 2021-04-26 DIAGNOSIS — Z79899 Other long term (current) drug therapy: Secondary | ICD-10-CM | POA: Diagnosis not present

## 2021-04-26 DIAGNOSIS — I10 Essential (primary) hypertension: Secondary | ICD-10-CM | POA: Diagnosis present

## 2021-04-26 DIAGNOSIS — N400 Enlarged prostate without lower urinary tract symptoms: Secondary | ICD-10-CM | POA: Diagnosis present

## 2021-04-26 LAB — URINALYSIS, ROUTINE W REFLEX MICROSCOPIC
Bilirubin Urine: NEGATIVE
Glucose, UA: 100 mg/dL — AB
Ketones, ur: NEGATIVE mg/dL
Leukocytes,Ua: NEGATIVE
Nitrite: NEGATIVE
Protein, ur: NEGATIVE mg/dL
Specific Gravity, Urine: 1.02 (ref 1.005–1.030)
pH: 7 (ref 5.0–8.0)

## 2021-04-26 LAB — RESP PANEL BY RT-PCR (FLU A&B, COVID) ARPGX2
Influenza A by PCR: NEGATIVE
Influenza B by PCR: NEGATIVE
SARS Coronavirus 2 by RT PCR: NEGATIVE

## 2021-04-26 LAB — CBC WITH DIFFERENTIAL/PLATELET
Abs Immature Granulocytes: 0.02 10*3/uL (ref 0.00–0.07)
Basophils Absolute: 0 10*3/uL (ref 0.0–0.1)
Basophils Relative: 1 %
Eosinophils Absolute: 0 10*3/uL (ref 0.0–0.5)
Eosinophils Relative: 0 %
HCT: 39.1 % (ref 39.0–52.0)
Hemoglobin: 13.2 g/dL (ref 13.0–17.0)
Immature Granulocytes: 0 %
Lymphocytes Relative: 14 %
Lymphs Abs: 1 10*3/uL (ref 0.7–4.0)
MCH: 32.8 pg (ref 26.0–34.0)
MCHC: 33.8 g/dL (ref 30.0–36.0)
MCV: 97.3 fL (ref 80.0–100.0)
Monocytes Absolute: 0.6 10*3/uL (ref 0.1–1.0)
Monocytes Relative: 8 %
Neutro Abs: 5.4 10*3/uL (ref 1.7–7.7)
Neutrophils Relative %: 77 %
Platelets: 199 10*3/uL (ref 150–400)
RBC: 4.02 MIL/uL — ABNORMAL LOW (ref 4.22–5.81)
RDW: 13.2 % (ref 11.5–15.5)
WBC: 7.1 10*3/uL (ref 4.0–10.5)
nRBC: 0 % (ref 0.0–0.2)

## 2021-04-26 LAB — BASIC METABOLIC PANEL
Anion gap: 5 (ref 5–15)
BUN: 18 mg/dL (ref 8–23)
CO2: 28 mmol/L (ref 22–32)
Calcium: 8.8 mg/dL — ABNORMAL LOW (ref 8.9–10.3)
Chloride: 105 mmol/L (ref 98–111)
Creatinine, Ser: 1.58 mg/dL — ABNORMAL HIGH (ref 0.61–1.24)
GFR, Estimated: 44 mL/min — ABNORMAL LOW (ref >60)
Glucose, Bld: 84 mg/dL (ref 70–99)
Potassium: 4.1 mmol/L (ref 3.5–5.1)
Sodium: 138 mmol/L (ref 135–145)

## 2021-04-26 LAB — CBG MONITORING, ED
Glucose-Capillary: 127 mg/dL — ABNORMAL HIGH (ref 70–99)
Glucose-Capillary: 141 mg/dL — ABNORMAL HIGH (ref 70–99)
Glucose-Capillary: 33 mg/dL — CL (ref 70–99)
Glucose-Capillary: 220 mg/dL — ABNORMAL HIGH (ref 70–99)
Glucose-Capillary: 139 mg/dL — ABNORMAL HIGH (ref 70–99)

## 2021-04-26 LAB — URINALYSIS, MICROSCOPIC (REFLEX)

## 2021-04-26 MED ORDER — HEPARIN SODIUM (PORCINE) 5000 UNIT/ML IJ SOLN
5000.0000 [IU] | Freq: Three times a day (TID) | INTRAMUSCULAR | Status: DC
  Administered 2021-04-27: 5000 [IU] via SUBCUTANEOUS
  Filled 2021-04-26: qty 1

## 2021-04-26 MED ORDER — DONEPEZIL HCL 5 MG PO TABS
5.0000 mg | ORAL_TABLET | Freq: Every day | ORAL | Status: DC
  Administered 2021-04-26: 5 mg via ORAL
  Filled 2021-04-26: qty 1

## 2021-04-26 MED ORDER — SODIUM CHLORIDE 0.9 % IV BOLUS
500.0000 mL | Freq: Once | INTRAVENOUS | Status: AC
  Administered 2021-04-26: 500 mL via INTRAVENOUS

## 2021-04-26 MED ORDER — ACETAMINOPHEN 650 MG RE SUPP: 650.0000 mg | Freq: Four times a day (QID) | RECTAL | Status: DC | PRN

## 2021-04-26 MED ORDER — FINASTERIDE 5 MG PO TABS: 5.0000 mg | ORAL_TABLET | Freq: Every day | ORAL | Status: DC

## 2021-04-26 MED ORDER — HYDRALAZINE HCL 20 MG/ML IJ SOLN: 2.0000 mg | INTRAMUSCULAR | Status: DC | PRN

## 2021-04-26 MED ORDER — ACETAMINOPHEN 325 MG PO TABS: 650.0000 mg | ORAL_TABLET | Freq: Four times a day (QID) | ORAL | Status: DC | PRN

## 2021-04-26 MED ORDER — ATORVASTATIN CALCIUM 20 MG PO TABS: 20.0000 mg | ORAL_TABLET | Freq: Every day | ORAL | Status: DC

## 2021-04-26 MED ORDER — TAMSULOSIN HCL 0.4 MG PO CAPS
0.4000 mg | ORAL_CAPSULE | Freq: Two times a day (BID) | ORAL | Status: DC
  Administered 2021-04-26: 0.4 mg via ORAL
  Filled 2021-04-26: qty 1

## 2021-04-26 MED ORDER — MEMANTINE HCL 10 MG PO TABS
10.0000 mg | ORAL_TABLET | Freq: Two times a day (BID) | ORAL | Status: DC
  Administered 2021-04-26: 10 mg via ORAL
  Filled 2021-04-26: qty 1

## 2021-04-26 MED ORDER — INSULIN ASPART 100 UNIT/ML IJ SOLN: 0.0000 [IU] | Freq: Three times a day (TID) | INTRAMUSCULAR | Status: DC

## 2021-04-26 MED ORDER — AMLODIPINE BESYLATE 5 MG PO TABS: 5.0000 mg | ORAL_TABLET | Freq: Every day | ORAL | Status: DC

## 2021-04-26 MED ORDER — DEXTROSE 50 % IV SOLN
INTRAVENOUS | Status: AC
  Administered 2021-04-26: 50 mL via INTRAVENOUS
  Filled 2021-04-26: qty 50

## 2021-04-26 MED ORDER — VITAMIN D 25 MCG (1000 UNIT) PO TABS
2000.0000 [IU] | ORAL_TABLET | Freq: Two times a day (BID) | ORAL | Status: DC
  Administered 2021-04-26: 2000 [IU] via ORAL
  Filled 2021-04-26: qty 2

## 2021-04-26 MED ORDER — DEXTROSE 50 % IV SOLN: 1.0000 | Freq: Once | INTRAVENOUS | Status: AC

## 2021-04-26 MED ORDER — QUETIAPINE FUMARATE 25 MG PO TABS
25.0000 mg | ORAL_TABLET | Freq: Every day | ORAL | Status: DC
  Administered 2021-04-26: 25 mg via ORAL
  Filled 2021-04-26: qty 1

## 2021-04-26 MED ORDER — SERTRALINE HCL 50 MG PO TABS: 25.0000 mg | ORAL_TABLET | Freq: Every day | ORAL | Status: DC

## 2021-04-26 MED ORDER — DEXTROSE 50 % IV SOLN: 1.0000 | Freq: Once | INTRAVENOUS | Status: DC

## 2021-04-26 MED ORDER — LISINOPRIL 10 MG PO TABS: 10.0000 mg | ORAL_TABLET | Freq: Every day | ORAL | Status: DC

## 2021-04-26 NOTE — ED Notes (Signed)
Snacks and drink given to pt

## 2021-04-26 NOTE — ED Notes (Signed)
Pt ate apple sauce, fruit bowl, pack of peanut butter nabs, and coke.

## 2021-04-26 NOTE — ED Triage Notes (Signed)
Pt presents to ED via [facility]. EMS called out for sick call, CBG 35, 250 ml of D10 given CBG came up to 190. Pt has since has chicken sandwich and coke. Pt now alert and responsive. Pt son states family doctor also wanted him checked for UTI.

## 2021-04-26 NOTE — Discharge Instructions (Addendum)
Return to ED with any new or worsening symptoms such as altered mental status, passing out, lethargy It is important that you track your blood glucose levels and maintain a log of them so that we are able to notice a trend in their levels  Hold any future doses of glipizide until you have discussed this new found low blood glucose with him It is important that you follow up with your PCP for evaluation and management of your new blood glucose medication Attempt to eat more consistent and regular meals

## 2021-04-26 NOTE — Telephone Encounter (Signed)
With sudden changes, would still make sure there is no underlying infection before changing medication. Pls have him call PCP to let them know and check for UTI. Thanks

## 2021-04-26 NOTE — H&P (Signed)
History and Physical    FABIO HORATH [id number] DOB: 06-17-1940 DOA: 04/26/2021  PCP: Vidal Schwalbe, MD Patient coming from: home  Chief Complaint: hypoglycemia`    HPI: CHINEDU HUMPHREYS is a 80 y.o. male with medical history significant of Dementia, BPH, DM, HLD, HTN presenting to the ED w/ son after acting acnormally this am. Pt was described as weak and lethargic. EMS was called and pt was noted to have a CBG of 35. Given D10 by EMS and CBG on arrival in ED 190. Of note pt had gliipzide 5mg  BID added to medication regimen over the last few days. Pts family notes he's not acted his normal self since that time. Metforming at tha ttime was [facility]. Denies any CP, SOB, Syncope, HA, n/v, cognitive change.   Level 5 caveat given dementia. Majority of history provided by pt and EDP. Pt able to give basic yes and no answers   ED Course: After initial glucose noted int he Ed of 190 pt was placed in observation and several hours later glucose dropped to 33. Pt given 1 ampule of D50 and hospitalist services were called fo admission.   Review of Systems: As per HPI otherwise all other systems reviewed and are negative  Ambulatory Status:no restrictions  Past Medical History:  Diagnosis Date   BPH (benign prostatic hyperplasia)    Diabetes mellitus without complication (HCC)    High cholesterol    Hypertension     History reviewed. No pertinent surgical history.  Social History   Socioeconomic History   Marital status: Single    Spouse name: Not on file   Number of children: 3   Years of education: Not on file   Highest education level: Never attended school  Occupational History   Occupation: retired Armed forces logistics/support/administrative officer  Tobacco Use   Smoking status: Never   Smokeless tobacco: Never  Vaping Use   Vaping Use: Never used  Substance and Sexual Activity   Alcohol use: No   Drug use: No   Sexual activity: Not on file  Other Topics Concern   Not on file  Social History Narrative    Lives alone in a one story home.  Has 3 children.  Retired Armed forces logistics/support/administrative officer.  Education: none   Right handed   Caffeine yes.    Social Determinants of Health   Financial Resource Strain: Not on file  Food Insecurity: Not on file  Transportation Needs: Not on file  Physical Activity: Not on file  Stress: Not on file  Social Connections: Not on file  Intimate Partner Violence: Not on file    No Known Allergies  Family History  Problem Relation Age of Onset   Cancer Father       Prior to Admission medications   Medication Sig Start Date End Date Taking? Authorizing Provider  amLODipine (NORVASC) 5 MG tablet Take 5 mg by mouth daily. 03/31/21  Yes [provider]  atorvastatin (LIPITOR) 40 MG tablet Take 20 mg by mouth daily.    Yes [provider]  Cholecalciferol (VITAMIN D3) 50 MCG (2000 UT) TABS Take 2,000 Units by mouth 2 (two) times daily. 04/05/18  Yes [provider]  donepezil (ARICEPT) 10 MG tablet Take 1 tablet every night 01/12/21  Yes Cameron Sprang, MD  finasteride (PROSCAR) 5 MG tablet Take 5 mg by mouth daily. 04/05/18  Yes [provider]  glipiZIDE (GLUCOTROL) 5 MG tablet Take 5 mg by mouth 2 (two) times daily before a meal.  Yes [provider]  lisinopril (PRINIVIL,ZESTRIL) 5 MG tablet Take 10 mg by mouth daily.   Yes [provider]  memantine (NAMENDA) 10 MG tablet TAKE 1 TABLET TWICE DAILY Patient taking differently: Take 10 mg by mouth 2 (two) times daily. 01/12/21  Yes Van Clines, MD  QUEtiapine (SEROQUEL) 25 MG tablet Take 1 tablet every night 04/21/21  Yes Van Clines, MD  sertraline (ZOLOFT) 25 MG tablet Take 1 tablet (25 mg total) by mouth daily. 01/12/21  Yes Van Clines, MD  tamsulosin (FLOMAX) 0.4 MG CAPS capsule Take 0.4 mg by mouth 2 (two) times daily. 04/05/18  Yes [provider]  furosemide (LASIX) 20 MG tablet Take 1 tablet (20 mg total) by mouth every other day as needed for fluid  or edema. Patient not taking: Reported on 04/26/2021 03/16/17   Cleora Fleet, MD  metFORMIN (GLUCOPHAGE) 500 MG tablet Take 500 mg by mouth daily with breakfast. Patient not taking: Reported on 04/26/2021    [provider]    Physical Exam: Vitals:   04/26/21 1700 04/26/21 1730 04/26/21 1800 04/26/21 1830  BP: 136/65 139/67 (!) 141/70 (!) 151/76  Pulse: 81 81 73 78  Resp: 20 18 19 17   Temp:      TempSrc:      SpO2: 100% 99% 99% 99%  Weight:      Height:         General:  Appears calm and comfortable Eyes:  PERRL, EOMI, normal lids, iris ENT:  grossly normal hearing, lips & tongue, mmm Neck:  no LAD, masses or thyromegaly Cardiovascular:  RRR, no m/r/g. No LE edema.  Respiratory:  CTA bilaterally, no w/r/r. Normal respiratory effort. Abdomen:  soft, ntnd, NABS Skin:  no rash or induration seen on limited exam Musculoskeletal:  grossly normal tone BUE/BLE, good ROM, no bony abnormality Psychiatric:  flattened affect, speech fluent and appropriate, AOx3 Neurologic:  CN 2-12 grossly intact, moves all extremities in coordinated fashion, sensation intact  Labs on Admission: I have personally reviewed following labs and imaging studies  CBC: Recent Labs  Lab 04/26/21 1332  WBC 7.1  NEUTROABS 5.4  HGB 13.2  HCT 39.1  MCV 97.3  PLT 199   Basic Metabolic Panel: Recent Labs  Lab 04/26/21 1332  NA 138  K 4.1  CL 105  CO2 28  GLUCOSE 84  BUN 18  CREATININE 1.58*  CALCIUM 8.8*   GFR: Estimated Creatinine Clearance: 36.7 mL/min (A) (by C-G formula based on SCr of 1.58 mg/dL (H)). Liver Function Tests: No results for input(s): AST, ALT, ALKPHOS, BILITOT, PROT, ALBUMIN in the last 168 hours. No results for input(s): LIPASE, AMYLASE in the last 168 hours. No results for input(s): AMMONIA in the last 168 hours. Coagulation Profile: No results for input(s): INR, PROTIME in the last 168 hours. Cardiac Enzymes: No results for input(s): CKTOTAL, CKMB,  CKMBINDEX, TROPONINI in the last 168 hours. BNP (last 3 results) No results for input(s): PROBNP in the last 8760 hours. HbA1C: No results for input(s): HGBA1C in the last 72 hours. CBG: Recent Labs  Lab 04/26/21 1154 04/26/21 1540 04/26/21 1605 04/26/21 1738 04/26/21 1951  GLUCAP 127* 33* 141* 220* 139*   Lipid Profile: No results for input(s): CHOL, HDL, LDLCALC, TRIG, CHOLHDL, LDLDIRECT in the last 72 hours. Thyroid Function Tests: No results for input(s): TSH, T4TOTAL, FREET4, T3FREE, THYROIDAB in the last 72 hours. Anemia Panel: No results for input(s): VITAMINB12, FOLATE, FERRITIN, TIBC, IRON, RETICCTPCT in  the last 72 hours. Urine analysis:    Component Value Date/Time   COLORURINE YELLOW 04/26/2021 1325   APPEARANCEUR CLEAR 04/26/2021 1325   LABSPEC 1.020 04/26/2021 1325   PHURINE 7.0 04/26/2021 1325   GLUCOSEU 100 (A) 04/26/2021 1325   HGBUR MODERATE (A) 04/26/2021 1325   BILIRUBINUR NEGATIVE 04/26/2021 1325   KETONESUR NEGATIVE 04/26/2021 1325   PROTEINUR NEGATIVE 04/26/2021 1325   NITRITE NEGATIVE 04/26/2021 1325   LEUKOCYTESUR NEGATIVE 04/26/2021 1325    Creatinine Clearance: Estimated Creatinine Clearance: 36.7 mL/min (A) (by C-G formula based on SCr of 1.58 mg/dL (H)).  Sepsis Labs: @LABRCNTIP (procalcitonin:4,lacticidven:4) ) Recent Results (from the past 240 hour(s))  Resp Panel by RT-PCR (Flu A&B, Covid) Nasopharyngeal Swab     Status: None   Collection Time: 04/26/21  5:40 PM   Specimen: Nasopharyngeal Swab; Nasopharyngeal(NP) swabs in vial transport medium  Result Value Ref Range Status   SARS Coronavirus 2 by RT PCR NEGATIVE NEGATIVE Final    Comment: (NOTE) SARS-CoV-2 target nucleic acids are NOT DETECTED.  The SARS-CoV-2 RNA is generally detectable in upper respiratory specimens during the acute phase of infection. The lowest concentration of SARS-CoV-2 viral copies this assay can detect is 138 copies/mL. A negative result does not preclude  SARS-Cov-2 infection and should not be used as the sole basis for treatment or other patient management decisions. A negative result may occur with  improper specimen collection/handling, submission of specimen other than nasopharyngeal swab, presence of viral mutation(s) within the areas targeted by this assay, and inadequate number of viral copies(<138 copies/mL). A negative result must be combined with clinical observations, patient history, and epidemiological information. The expected result is Negative.  Fact Sheet for Patients:  EntrepreneurPulse.com.au  Fact Sheet for Healthcare Providers:  IncredibleEmployment.be  This test is no t yet approved or cleared by the [place] FDA and  has been authorized for detection and/or diagnosis of SARS-CoV-2 by FDA under an Emergency Use Authorization (EUA). This EUA will remain  in effect (meaning this test can be used) for the duration of the COVID-19 declaration under Section 564(b)(1) of the Act, 21 U.S.C.section 360bbb-3(b)(1), unless the authorization is terminated  or revoked sooner.       Influenza A by PCR NEGATIVE NEGATIVE Final   Influenza B by PCR NEGATIVE NEGATIVE Final    Comment: (NOTE) The Xpert Xpress SARS-CoV-2/FLU/RSV plus assay is intended as an aid in the diagnosis of influenza from Nasopharyngeal swab specimens and should not be used as a sole basis for treatment. Nasal washings and aspirates are unacceptable for Xpert Xpress SARS-CoV-2/FLU/RSV testing.  Fact Sheet for Patients: EntrepreneurPulse.com.au  Fact Sheet for Healthcare Providers: IncredibleEmployment.be  This test is not yet approved or cleared by the [place] FDA and has been authorized for detection and/or diagnosis of SARS-CoV-2 by FDA under an Emergency Use Authorization (EUA). This EUA will remain in effect (meaning this test can be used) for the duration of  the COVID-19 declaration under Section 564(b)(1) of the Act, 21 U.S.C. section 360bbb-3(b)(1), unless the authorization is terminated or revoked.  Performed at [facility], [address] [place], [place] [address]      Radiological Exams on Admission: No results found.   Assessment/Plan Principal Problem:   Hypoglycemia Active Problems:   BPH (benign prostatic hyperplasia)   DM (diabetes mellitus) ([place])   Essential hypertension   Hyperlipidemia   CKD (chronic kidney disease), stage III (HCC)   Dementia (HCC)    Hypoglycemia: secondary to combination of new  medication, glipizide 5mg  BID, and poor renal function w/ baseline GFR in the 50s, and poor oral intake (pt eats very little after 3pm per family). Glucose given over several hours w/ continue drop in glucose.  - CBG Q2 - encourage PO intake - Glucose IV PRN - Diabetic changes as below  DM: Unsure of recent A1c or why the change from Metformin to glipizide. Renal function borderline to stop metformin which is likely for the DC metformin. Glipizide dose appears too strong.  - A1c - Consider Glipizide 2.5 mg QAM at time of DC - SSI  Dementia: associated w/ significant sleep disturbances per family - continue Namenda, Arasept, Seroquel.   HTN: - continue Norvasc, Lisinopril  BPH: - continue flomax and Finasteride  HLD: - continue staitn    DVT prophylaxis: hep  Code Status: full  Family Communication: son  Disposition Plan: pending normalization of glucose  Consults called: none  Admission status: obs    Waldemar Dickens MD [facility]  If 7PM-7AM, please contact night-coverage [email or web address] Password [email or web address]  04/26/2021, 10:40 PM

## 2021-04-26 NOTE — ED Notes (Signed)
Pt was given evening tray but refused to eat any of it. Tray in room in pt changes their mind.

## 2021-04-26 NOTE — Telephone Encounter (Signed)
Pt's son called in stating his father has had a "drastic" change since Friday. The patient keeps hallucinating and his emotions are everywhere. Fayrene Fearing would like to see if his medications could be adjusted?

## 2021-04-26 NOTE — Telephone Encounter (Signed)
I notified son, patient needs to see  PCP, or take to ER, he said, "he would see what he could do". Advised to call back with report.

## 2021-04-26 NOTE — Telephone Encounter (Signed)
Son called patient is on seroquel 25mg  1 tab po qd, namenda 10mg  1 tab po bid, Aricept 10mg  1 tab po qd. No urinary syptoms, no fever. Patient is having hallucinations, talking to the dead. Noticed more of this since time change. Son has not taken patient out of area, same location. [phone], [phone]. Please advise

## 2021-04-26 NOTE — ED Provider Notes (Addendum)
[name] EMERGENCY DEPARTMENT Provider Note   CSN: [id number] Arrival date & time: 04/26/21  1121     History Chief Complaint  Patient presents with   Hypoglycemia    Craig Sims is a 80 y.o. male with medical history significant for DM, dementia, hypertension who presents to ED for chief complaint of hypoglycemia.  Patient has dementia at baseline and majority of history is provided by his son who was at bedside.  Per son, patient began acting abnormal this morning and seemed more weak and lethargic.  EMS was called to the house who assessed his blood glucose and got a CBG of 35.  Patient was given D10 in route to hospital and CBG improved to 190 per EMS.  Patient additionally had a chicken sandwich and Coke.  On examination, patient son states that his father is doing much better and seems more alert and at his baseline mental status.  Per son, patient recently had his diabetes medication changed from metformin to glipizide 5 mg twice a day.  Patient's son states that ever since this change occurred patient has had issues with his blood glucose.  Patient states that his father does not like to have his finger.  And thus they do not track his blood glucose at home.  Patient's family medicine doctor additionally would like the patient checked for a UTI as patient has been urinating on himself ever since he had his medication changed.  Patient endorses enuresis.  Patient denies any urinary symptoms or painful urination, shortness of breath chest pain nausea, vomiting, diarrhea, fevers, uncontrollable thirst, uncontrollable hunger.  The history is provided by a relative.  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 No pertinent surgical history.     Family History  Problem Relation Age of Onset   Cancer Father     Social History   Tobacco Use   Smoking status: Never   Smokeless tobacco: Never  Vaping Use   Vaping Use: Never used  Substance Use Topics   Alcohol use: No   Drug use: No    Home Medications Prior to Admission medications   Medication Sig Start Date End Date Taking? Authorizing Provider  amLODipine (NORVASC) 5 MG tablet Take 5 mg by mouth daily. 03/31/21  Yes [provider]  atorvastatin (LIPITOR) 40 MG tablet Take 20 mg by mouth daily.    Yes [provider]  Cholecalciferol (VITAMIN D3) 50 MCG (2000 UT) TABS Take 2,000 Units by mouth 2 (two) times daily. 04/05/18  Yes [provider]  donepezil (ARICEPT) 10 MG tablet Take 1 tablet every night 01/12/21  Yes Van Clines, MD  finasteride (PROSCAR) 5 MG tablet Take 5 mg by mouth daily. 04/05/18  Yes [provider]  glipiZIDE (GLUCOTROL) 5 MG tablet Take 5 mg by mouth 2 (two) times daily before a meal.   Yes [provider]  lisinopril (PRINIVIL,ZESTRIL) 5 MG  tablet Take 10 mg by mouth daily.   Yes [provider]  memantine (NAMENDA) 10 MG tablet TAKE 1 TABLET TWICE DAILY Patient taking differently: Take 10 mg by mouth 2 (two) times daily. 01/12/21  Yes Van Clines, MD  QUEtiapine (SEROQUEL) 25 MG tablet Take 1 tablet every night 04/21/21  Yes Van Clines, MD  sertraline (ZOLOFT) 25 MG tablet Take 1 tablet (25 mg total) by mouth daily. 01/12/21  Yes Van Clines, MD  tamsulosin (FLOMAX) 0.4 MG CAPS capsule Take 0.4 mg by mouth 2 (two) times daily. 04/05/18  Yes [provider]   furosemide (LASIX) 20 MG tablet Take 1 tablet (20 mg total) by mouth every other day as needed for fluid or edema. Patient not taking: Reported on 04/26/2021 03/16/17   Cleora Fleet, MD  metFORMIN (GLUCOPHAGE) 500 MG tablet Take 500 mg by mouth daily with breakfast. Patient not taking: Reported on 04/26/2021    [provider]    Allergies    Patient has no known allergies.  Review of Systems   Review of Systems  Constitutional:  Negative for fever.  HENT:  Negative for sore throat.   Respiratory:  Negative for shortness of breath.   Cardiovascular:  Negative for chest pain.  Gastrointestinal:  Negative for abdominal pain, diarrhea, nausea and vomiting.  Genitourinary:  Positive for enuresis. Negative for decreased urine volume, difficulty urinating, dysuria and frequency.  Psychiatric/Behavioral:  Negative for confusion.   All other systems reviewed and are negative.  Physical Exam Updated Vital Signs BP (!) 148/84   Pulse 68   Temp 98.2 F (36.8 C) (Oral)   Resp (!) 22   Ht 5\' 8"  (1.727 m)   Wt 77.1 kg   SpO2 99%   BMI 25.85 kg/m   Physical Exam Constitutional:      General: He is not in acute distress.    Appearance: He is not toxic-appearing.  HENT:     Head: Normocephalic.     Nose: Nose normal.     Mouth/Throat:     Mouth: Mucous membranes are moist.  Eyes:     Extraocular Movements: Extraocular movements intact.     Pupils: Pupils are equal, round, and reactive to light.  Cardiovascular:     Rate and Rhythm: Normal rate and regular rhythm.  Pulmonary:     Effort: Pulmonary effort is normal.     Breath sounds: Normal breath sounds. No wheezing.  Abdominal:     General: Abdomen is flat.     Palpations: Abdomen is soft.     Tenderness: There is no abdominal tenderness.  Skin:    General: Skin is warm and dry.     Capillary Refill: Capillary refill takes less than 2 seconds.  Neurological:     Mental Status: Mental status is at baseline.   Psychiatric:        Behavior: Behavior normal.    ED Results / Procedures / Treatments   Labs (all labs ordered are listed, but only abnormal results are displayed) Labs Reviewed  URINALYSIS, ROUTINE W REFLEX MICROSCOPIC - Abnormal; Notable for the following components:      Result Value   Glucose, UA 100 (*)    Hgb urine dipstick MODERATE (*)    All other components within normal limits  CBC WITH DIFFERENTIAL/PLATELET - Abnormal; Notable for the following components:   RBC 4.02 (*)    All other components within normal limits  BASIC METABOLIC PANEL - Abnormal; Notable  for the following components:   Creatinine, Ser 1.58 (*)    Calcium 8.8 (*)    GFR, Estimated 44 (*)    All other components within normal limits  URINALYSIS, MICROSCOPIC (REFLEX) - Abnormal; Notable for the following components:   Bacteria, UA FEW (*)    All other components within normal limits  CBG MONITORING, ED - Abnormal; Notable for the following components:   Glucose-Capillary 127 (*)    All other components within normal limits  CBG MONITORING, ED    EKG None  Radiology No results found.  Procedures .Critical Care Performed by: Al Decant, PA Authorized by: Al Decant, PA   Critical care provider statement:    Critical care time (minutes):  60   Critical care was necessary to treat or prevent imminent or life-threatening deterioration of the following conditions:  Endocrine crisis   Critical care was time spent personally by me on the following activities:  Examination of patient, discussions with consultants, evaluation of patient's response to treatment, development of treatment plan with patient or surrogate, obtaining history from patient or surrogate, review of old charts, pulse oximetry, re-evaluation of patient's condition, ordering and review of laboratory studies and ordering and performing treatments and interventions   Care discussed with: admitting provider    Comments:     Persistently hypoglycemic patient with recent medication change.    Medications Ordered in ED Medications  dextrose 50 % solution 50 mL (50 mLs Intravenous Given 04/26/21 1546)    ED Course  I have reviewed the triage vital signs and the nursing notes.  Pertinent labs & imaging results that were available during my care of the patient were reviewed by me and considered in my medical decision making (see chart for details).    MDM Rules/Calculators/A&P                          80 year old male with medical history of diabetes, dementia, hypertension presents for episode of hypoglycemia this morning.  On examination, patient is afebrile, not hypoxic, at baseline secondary to underlying dementia.  Majority of history is provided by son who is at bedside.  Son states that his father recently had medication change from metformin to glipizide 5 mg twice daily last week and ever since then his blood glucose has been low.  When asked how son knows that his blood sugars been low, he states that the patient becomes more lethargic and slower to respond to questions.  Patient does not have regular blood glucose checks at home.  Patient son continues that patient also began going to the bathroom on himself ever since medication change.  Patient's family medicine doctor has requested that he be checked for urinary tract infection.  Labs ordered and reviewed by myself include BMP, CBC, urinalysis. Patient CBC has significant result of decreased RBC to 4.02.  On review of patient chart however this is i consistent with his baseline.  Patient BMP significant for elevated creatinine 1.58, this is in line with patient baseline however.  Patient urinalysis significant for moderate hemoglobin in the urine.  There are no nitrites, leukocytes in urine.  Suspicion for UTI is low at this time.  I have explained to the patient's son that his new onset enuresis could just be a progression of his  underlying dementia or perhaps his sugars being dropped so low and his mentation is becoming altered to the point that he pees on himself.  Patient  denies fever, abdominal pain, flank pain, painful urination.  Patient son states that patient typically does not eat meals after 3:30 in the afternoon.  I explained to the patient's son that patient's blood sugar is most likely dropping due to prolonged periods of time without ingesting food.  Patient and son expressed understanding for need to have patient on a more consistent and regular meal schedule, especially before bedtime.  Patient will most likely need to be observed here in the ED for an extended period of time up to 4 hours.  Patient initial CBG was taken at 130 and resulted in a blood glucose reading of 127. If patient's blood sugar remained stable, patient will be discharged and advised to follow-up with his PCP for management and reevaluation of his diabetes medication.  I have explained this to patient's son who is understanding of need to follow-up with PCP to reevaluate the dosage of patient's diabetes medication.  I have also explained to the patient son that it is important that he tracks his father's blood sugar and keep a log of it so that we can notice any trends.  On re-evaluation of patients blood glucose at 3:43 patient blood glucose is decreased to 33. Patient blood glucose will be addressed with 1 ampule of D50 and patient will be offered food. Patient will need to remain in the ED for further evaluation and management of his blood glucose.   At present, plan will be to attempt to stabilize patient's persistent hypoglycemia here in the ED.  If patient continues to drop his blood glucose, patient will be admitted for further observation and management.  At end of shift this patient will be signed out to [facility]. Patient is stable at time of sign out.       Final Clinical Impression(s) / ED Diagnoses Final diagnoses:   Hypoglycemia associated with diabetes [facility])    Rx / DC Orders ED Discharge Orders     None        Al Decant, [place] 04/26/21 1558    Al Decant, [place] 04/26/21 1559    Al Decant, PA 04/27/21 1302    Derwood Kaplan, MD 04/28/21 [phone]

## 2021-04-26 NOTE — ED Provider Notes (Signed)
Pt's care assumed at 5pm.  Pt started on glipizide.  Pt had glucose of 35 this am.  Pt was on metformin previously without complication. Pt has also recently had urinary incontinence. EMS gave d10 .   Pt observed in ED.  Pt ate and drank.  Pt had second episode of hypoglycemia with glucose decreased to 33.  Dr. Rhunette Croft evaluated pt and advised admission    Osie Cheeks 04/26/21 1746    Bethann Berkshire, MD 04/27/21 1454

## 2021-04-27 DIAGNOSIS — N183 Chronic kidney disease, stage 3 unspecified: Secondary | ICD-10-CM | POA: Diagnosis not present

## 2021-04-27 DIAGNOSIS — N400 Enlarged prostate without lower urinary tract symptoms: Secondary | ICD-10-CM | POA: Diagnosis not present

## 2021-04-27 DIAGNOSIS — F0150 Vascular dementia without behavioral disturbance: Secondary | ICD-10-CM

## 2021-04-27 DIAGNOSIS — E162 Hypoglycemia, unspecified: Secondary | ICD-10-CM | POA: Diagnosis not present

## 2021-04-27 DIAGNOSIS — I10 Essential (primary) hypertension: Secondary | ICD-10-CM

## 2021-04-27 LAB — GLUCOSE, CAPILLARY
Glucose-Capillary: 85 mg/dL (ref 70–99)
Glucose-Capillary: 77 mg/dL (ref 70–99)
Glucose-Capillary: 67 mg/dL — ABNORMAL LOW (ref 70–99)
Glucose-Capillary: 76 mg/dL (ref 70–99)
Glucose-Capillary: 85 mg/dL (ref 70–99)
Glucose-Capillary: 153 mg/dL — ABNORMAL HIGH (ref 70–99)
Glucose-Capillary: 94 mg/dL (ref 70–99)

## 2021-04-27 LAB — CBC
HCT: 37.3 % — ABNORMAL LOW (ref 39.0–52.0)
Hemoglobin: 12.3 g/dL — ABNORMAL LOW (ref 13.0–17.0)
MCH: 31.9 pg (ref 26.0–34.0)
MCHC: 33 g/dL (ref 30.0–36.0)
MCV: 96.6 fL (ref 80.0–100.0)
Platelets: 189 10*3/uL (ref 150–400)
RBC: 3.86 MIL/uL — ABNORMAL LOW (ref 4.22–5.81)
RDW: 13 % (ref 11.5–15.5)
WBC: 6.2 10*3/uL (ref 4.0–10.5)
nRBC: 0 % (ref 0.0–0.2)

## 2021-04-27 LAB — COMPREHENSIVE METABOLIC PANEL
ALT: 28 U/L (ref 0–44)
AST: 40 U/L (ref 15–41)
Albumin: 3.5 g/dL (ref 3.5–5.0)
Alkaline Phosphatase: 51 U/L (ref 38–126)
Anion gap: 5 (ref 5–15)
BUN: 18 mg/dL (ref 8–23)
CO2: 27 mmol/L (ref 22–32)
Calcium: 8.5 mg/dL — ABNORMAL LOW (ref 8.9–10.3)
Chloride: 106 mmol/L (ref 98–111)
Creatinine, Ser: 1.47 mg/dL — ABNORMAL HIGH (ref 0.61–1.24)
GFR, Estimated: 48 mL/min — ABNORMAL LOW (ref >60)
Glucose, Bld: 88 mg/dL (ref 70–99)
Potassium: 4 mmol/L (ref 3.5–5.1)
Sodium: 138 mmol/L (ref 135–145)
Total Bilirubin: 0.9 mg/dL (ref 0.3–1.2)
Total Protein: 7.1 g/dL (ref 6.5–8.1)

## 2021-04-27 LAB — HEMOGLOBIN A1C
Hgb A1c MFr Bld: 5.6 % (ref 4.8–5.6)
Mean Plasma Glucose: 114.02 mg/dL

## 2021-04-27 MED ORDER — GLIPIZIDE 5 MG PO TABS: 2.5000 mg | ORAL_TABLET | Freq: Every day | ORAL | 3 refills | Status: DC

## 2021-04-27 NOTE — Discharge Summary (Signed)
Physician Discharge Summary   Patient ID: Craig Sims MRN: [id number] DOB/AGE: 1941-01-17 80 y.o.  Admit date: 04/26/2021 Discharge date: 04/27/2021  Primary Care Physician:  Vidal Schwalbe, MD   Recommendations for Outpatient Follow-up:  Follow up with PCP in 1-2 weeks Glipizide decreased to 2.5 mg every morning.  Patient may not need any oral hypoglycemics if any further hypoglycemia episodes.   Home Health: None  Equipment/Devices:   Discharge Condition: stable CODE STATUS: FULL  Diet recommendation: Carb modified diet   Discharge Diagnoses:     Hypoglycemia  Essential hypertension  Hyperlipidemia  BPH (benign prostatic hyperplasia) CKD stage IIIa  Consults: Diabetic coordinator    Allergies:  No Known Allergies   DISCHARGE MEDICATIONS: Allergies as of 04/27/2021   No Known Allergies      Medication List     STOP taking these medications    furosemide 20 MG tablet Commonly known as: LASIX   glipiZIDE 5 MG tablet Commonly known as: GLUCOTROL   metFORMIN 500 MG tablet Commonly known as: GLUCOPHAGE       TAKE these medications    amLODipine 5 MG tablet Commonly known as: NORVASC Take 5 mg by mouth daily.   atorvastatin 40 MG tablet Commonly known as: LIPITOR Take 20 mg by mouth daily.   donepezil 10 MG tablet Commonly known as: ARICEPT Take 1 tablet every night   finasteride 5 MG tablet Commonly known as: PROSCAR Take 5 mg by mouth daily.   lisinopril 5 MG tablet Commonly known as: ZESTRIL Take 10 mg by mouth daily.   memantine 10 MG tablet Commonly known as: NAMENDA TAKE 1 TABLET TWICE DAILY What changed:  how much to take how to take this when to take this additional instructions   QUEtiapine 25 MG tablet Commonly known as: SEROQUEL Take 1 tablet every night   sertraline 25 MG tablet Commonly known as: Zoloft Take 1 tablet (25 mg total) by mouth daily.   tamsulosin 0.4 MG Caps capsule Commonly known as:  FLOMAX Take 0.4 mg by mouth 2 (two) times daily.   Vitamin D3 50 MCG (2000 UT) Tabs Take 2,000 Units by mouth 2 (two) times daily.         Brief H and P: For complete details please refer to admission H and P, but in brief patient is a 80 year old male with dementia, BPH, diabetes mellitus type 2, NIDDM, hypertension, hyperlipidemia presented to ED after hematology.  EMS was called and patient CBG was noted to be 35.  He was given D10 by EMS and CBG on arrival was 190.  Patient had glipizide 5 mg twice a day added to his medication regimen over the last few days.  Patient's family noted that he was not acting his normal self since that time.  He used to be on metformin and was discontinued by his PCP likely due to CKD.  In ED, patient was placed in observation however several hours later glucose dropped again to 33, received 1 ampoule of D50 and was admitted  Hospital Course:   Generalized weakness, lethargy, hypoglycemia -Likely due to new medication, glipizide 5 mg twice daily, poor renal function, CKD stage IIIa, poor oral intake, eats very little after 3 PM per family. -Discontinue glipizide 5 mg twice daily, patient received normal saline fluid bolus, 1 amp of D50. -Discussed in detail with the patient's son at the bedside, recently had a hemoglobin A1c checked outpatient on 11/7 was 5.8.  - Patient does not really eat any  supper.  Recommended checking fasting blood sugar and at bedtime, and follow-up with PCP, may not need any oral hypoglycemics with age, poor oral intake and CKD. -Recommended Ensure/boost as a nutritional supplement in the evening if patient has noticeable - CBG at discharge 94  Diabetes mellitus type 2, NIDDM -With hypoglycemia.  Patient was on metformin twice daily, which was discontinued likely due to CKD -Patient was on glipizide 5 mg twice daily, dose appears to be too strong for the patient with CKD, poor oral intake, especially does not eat much after 3  PM -Patient was placed on SSI inpatient.  - Glipizide is discontinued, needs outpatient close follow-up with PCP, if needed to restart, recommend lowest dose 2.5 mg in the a.m. with breakfast  Dementia -Currently at baseline, continue Namenda, Aricept, Seroquel  BPH -Continue Flomax, finasteride  Hypertension -Stable, continue Norvasc, lisinopril  Hyperlipidemia -Continue statin  Day of Discharge S: Sitting up, son at the bedside.  Close to his baseline status  BP (!) 142/79 (BP Location: Right Arm)   Pulse 70   Temp 98.4 F (36.9 C) (Oral)   Resp 20   Ht 5\' 8"  (1.727 m)   Wt 73.9 kg   SpO2 100%   BMI 24.77 kg/m   Physical Exam: General: Awake, NAD, dementia CVS: S1-S2 clear no murmur rubs or gallops Chest: clear to auscultation bilaterally, no wheezing rales or rhonchi Abdomen: soft nontender, nondistended, normal bowel sounds Extremities: no cyanosis, clubbing or edema noted bilaterally Neuro: has dementia    Get Medicines reviewed and adjusted: Please take all your medications with you for your next visit with your Primary MD  Please request your Primary MD to go over all hospital tests and procedure/radiological results at the follow up. Please ask your Primary MD to get all Hospital records sent to his/her office.  If you experience worsening of your admission symptoms, develop shortness of breath, life threatening emergency, suicidal or homicidal thoughts you must seek medical attention immediately by calling 911 or calling your MD immediately  if symptoms less severe.  You must read complete instructions/literature along with all the possible adverse reactions/side effects for all the Medicines you take and that have been prescribed to you. Take any new Medicines after you have completely understood and accept all the possible adverse reactions/side effects.   Do not drive when taking pain medications.   Do not take more than prescribed Pain, Sleep and  Anxiety Medications  Special Instructions: If you have smoked or chewed Tobacco  in the last 2 yrs please stop smoking, stop any regular Alcohol  and or any Recreational drug use.  Wear Seat belts while driving.  Please note  You were cared for by a hospitalist during your hospital stay. Once you are discharged, your primary care physician will handle any further medical issues. Please note that NO REFILLS for any discharge medications will be authorized once you are discharged, as it is imperative that you return to your primary care physician (or establish a relationship with a primary care physician if you do not have one) for your aftercare needs so that they can reassess your need for medications and monitor your lab values.   The results of significant diagnostics from this hospitalization (including imaging, microbiology, ancillary and laboratory) are listed below for reference.      Procedures/Studies:  No results found.    LAB RESULTS: Basic Metabolic Panel: Recent Labs  Lab 04/26/21 1332 04/27/21 0703  NA 138 138  K  4.1 4.0  CL 105 106  CO2 28 27  GLUCOSE 84 88  BUN 18 18  CREATININE 1.58* 1.47*  CALCIUM 8.8* 8.5*   Liver Function Tests: Recent Labs  Lab 04/27/21 0703  AST 40  ALT 28  ALKPHOS 51  BILITOT 0.9  PROT 7.1  ALBUMIN 3.5   No results for input(s): LIPASE, AMYLASE in the last 168 hours. No results for input(s): AMMONIA in the last 168 hours. CBC: Recent Labs  Lab 04/26/21 1332 04/27/21 0703  WBC 7.1 6.2  NEUTROABS 5.4  --   HGB 13.2 12.3*  HCT 39.1 37.3*  MCV 97.3 96.6  PLT 199 189   Cardiac Enzymes: No results for input(s): CKTOTAL, CKMB, CKMBINDEX, TROPONINI in the last 168 hours. BNP: Invalid input(s): POCBNP CBG: Recent Labs  Lab 04/27/21 1032 04/27/21 1212  GLUCAP 153* 94       Disposition and Follow-up: Discharge Instructions     Diet Carb Modified   Complete by: As directed    Discharge instructions   Complete  by: As directed    It is VERY IMPORTANT that you follow up with a PCP on a regular basis.  Check your fasting blood glucoses and bedtime maintain a log of your readings.  Bring this log with you when you follow up with your PCP so that he or she can adjust your medications at your follow-up visit.  Please note glipizide has been decreased to 2.5 mg daily in the morning before breakfast.   Increase activity slowly   Complete by: As directed         DISPOSITION: home   [place]     Vidal Schwalbe, MD. Schedule an appointment as soon as possible for a visit in 1 week(s).   Specialty: Family Medicine Why: for hospital follow-up. Contact information: [address] [place] [address] [phone]                  Time coordinating discharge:  35 mins   Signed:   Estill Cotta M.D. [facility] 04/27/2021, 12:20 PM

## 2021-04-27 NOTE — Progress Notes (Addendum)
Inpatient Diabetes Program Recommendations  AACE/ADA: New Consensus Statement on Inpatient Glycemic Control (2015)  Target Ranges:  Prepandial:   less than 140 mg/dL      Peak postprandial:   less than 180 mg/dL (1-2 hours)      Critically ill patients:  140 - 180 mg/dL   Lab Results  Component Value Date   GLUCAP 85 04/27/2021   HGBA1C 6.1 (H) 03/13/2017    Review of Glycemic Control  Diabetes history: DM2 Outpatient Diabetes medications: Glipizide 5 mg BID  Current orders for Inpatient glycemic control: Novolog 0-6 units TID  Consult received for hypoglycemia with new dose of Glipizide added to regimen.  Noted Metformin was also discontinued likely due to kidney function.  A1C pending.  If less than 6.5% would recommend discontinuing Glipizide given age, CKD and diet regimen.   Will continue to follow while inpatient.  Thank you, Dulce Sellar, RN, BSN Diabetes Coordinator Inpatient Diabetes Program [phone] (team pager from 8a-5p)

## 2021-07-01 ENCOUNTER — Other Ambulatory Visit: Payer: Self-pay | Admitting: Neurology

## 2021-07-12 ENCOUNTER — Other Ambulatory Visit: Payer: Self-pay | Admitting: Neurology

## 2021-07-13 ENCOUNTER — Other Ambulatory Visit: Payer: Self-pay | Admitting: Neurology

## 2021-07-15 ENCOUNTER — Ambulatory Visit: Payer: Medicare HMO | Admitting: Physician Assistant

## 2021-07-15 NOTE — Progress Notes (Signed)
Assessment/Plan:     Late onset Dementia, moderate with behavioral disturbance  Currently on donepezil 10 mg daily and memantine 10 mg twice daily.  For mood he is on sertraline 25 mg daily and quetiapine 25 mg qhs, tolerating well.  MMSE test was incomplete, as the patient did not want to continue, unable to draw, or write a sentence, follow instructions, or read.  Delayed recall is 0.  Orientation 0.    Recommendations:  Discussed safety both in and out of the home.  Discussed the importance of regular daily schedule  to maintain brain function.  Stay active at least 30 minutes at least 3 times a week.  Naps should be scheduled and should be no longer than 60 minutes and should not occur after 2 PM.  Mediterranean diet is recommended  Control cardiovascular risk factors  Continue donepezil 10 mg daily and memantine 10 mg twice daily side effects were discussed Continue sertraline 25 mg daily, quetiapine 25 mg nightly for mood control, refill for sertraline was written. Follow up in 6 months.   Case discussed with Dr. Delice Lesch who agrees with the plan      Subjective:    Craig Sims is a very pleasant 81 y.o. RH male with a history of DM 2, hypertension, hyperlipidemia, CKD stage III, and a history of dementia with behavioral disturbance seen today in follow up for memory loss. This patient is accompanied in the office by his son Jeneen Rinks who supplements the history.  Previous records as well as any outside records available were reviewed prior to todays visit.  Patient was last seen at our office on 01/12/2021.  He is on donepezil 10 mg daily and memantine 10 mg twice daily.  For mood the is on Seroquel 25 mg nightly without side effects.  Since his last visit, was seen at the ED for hypoglycemic events. Today, he reports his memory is "about the same" but his son Jeneen Rinks reports that the memory may be worse, which causes increased frustration.  Jeneen Rinks and the patient's other son,  rotated to stay with him at night.  He is alone during the daytime, but his brother who lives next door checks on him.  He his family manages the medications, finances and meals.  He no longer drives.  He is independent with dressing and bathing.  Sleep is "up-and-down ".  Some nights, Jeneen Rinks hears him snoring, other denies he is up, walking around, talking, seeing people, without auditory component.  These hallucinations are not threatening to him, he has "gotten used to it ".  When the sugars are better to control, he can sleep better.  He has occasional headaches, dizziness, without a fall.  Appetite is good, denies trouble swallowing.  He does not cook.  Mostly, he states at home in his reclining, refusing to go out or join any day programs or senior center activities.  He has occasional left hand tremors, none today.  He denies any diplopia, dysarthria, dysphagia, neck or back pain, focal numbness, tingling or weakness, bladder or bowel dysfunction.  He continues to have reduced sense of smell.  History on Initial Assessment 04/25/2018: This is a 81 year old right-handed man with a history of hypertension, hyperlipidemia, diabetes, presenting for evaluation of dementia. He feels his memory is "fair, can't remember like before." He lives alone. His sons started noticing changes around a year ago, they would have conversations and he would drift off to a different topic. His sons report that  over the past 6 months, he would be awake at night turning all the lights with visual hallucinations. Last night he saw a little child in a corner with red shoes on. Hallucinations are mostly in the evening, they report he is okay during the daytime. He is easily agitated. He started having paranoia 2 months ago, thinking someone is trying to come in the house or someone is inside the house. He rarely drives but drove a week ago, denies getting lost. His son has always managed his bills. His sons have started fixing his  pillbox and making sure he takes his medications. Sons have started staying with him for the past week due to the worsening hallucinations. His family brought him to the ER yesterday due to worsening symptoms, he had a head CT without contrast which I personally reviewed, no acute changes, there was mild diffuse atrophy and chronic microvascular disease.   He has occasional left hand tremors. He has occasional headaches and dizziness when standing. He denies any diplopia, dysarthria/dysphagia, neck/back pain, focal numbness/tingling/weakness, bowel/bladder dysfunction. He has noticed reduced sense of smell. He naps during the day and gets fidgety at night, but does not wander outside the house. No family history of dementia. No history of significant head injuries or alcohol use.     PREVIOUS MEDICATIONS:   CURRENT MEDICATIONS:  Outpatient Encounter Medications as of 07/16/2021  Medication Sig   amLODipine (NORVASC) 5 MG tablet Take 5 mg by mouth daily.   atorvastatin (LIPITOR) 40 MG tablet Take 20 mg by mouth daily.    Cholecalciferol (VITAMIN D3) 50 MCG (2000 UT) TABS Take 2,000 Units by mouth 2 (two) times daily.   donepezil (ARICEPT) 10 MG tablet Take 1 tablet every night   finasteride (PROSCAR) 5 MG tablet Take 5 mg by mouth daily.   lisinopril (PRINIVIL,ZESTRIL) 5 MG tablet Take 10 mg by mouth daily.   memantine (NAMENDA) 10 MG tablet TAKE 1 TABLET TWICE DAILY (Patient taking differently: Take 10 mg by mouth 2 (two) times daily.)   QUEtiapine (SEROQUEL) 25 MG tablet TAKE 1 TABLET EVERY NIGHT.   tamsulosin (FLOMAX) 0.4 MG CAPS capsule Take 0.4 mg by mouth 2 (two) times daily.   [DISCONTINUED] sertraline (ZOLOFT) 25 MG tablet Take 1 tablet (25 mg total) by mouth daily.   sertraline (ZOLOFT) 25 MG tablet Take 1 tablet (25 mg total) by mouth daily.   No facility-administered encounter medications on file as of 07/16/2021.     Objective:     PHYSICAL EXAMINATION:    VITALS:   Vitals:    07/16/21 0925  BP: (!) 151/75  Pulse: 61  Resp: 18  SpO2: 98%  Weight: 175 lb (79.4 kg)  Height: 5\' 9"  (1.753 m)    GEN:  The patient appears stated age and is in NAD. HEENT:  Normocephalic, atraumatic.   Neurological examination:  General: NAD, well-groomed, appears stated age. Orientation: The patient is alert. Oriented to person, place and date Cranial nerves: There is good facial symmetry.The speech is noted fluid, but clear.. No aphasia or dysarthria. Fund of knowledge is reduced.  Recent and remote memory are impaired. Attention and concentration are reduced.  Able to name objects and repeat phrases.  Hearing is intact to conversational tone.    Sensation: Sensation is intact to light touch throughout Motor: Strength is at least antigravity x4. Tremors: none  DTR's 2/4 in UE/LE    No flowsheet data found. MMSE - Mini Mental State Exam 07/16/2021 12/12/2018 04/25/2018  Not completed: Refused - -  Orientation to time 0 2 3  Orientation to Place 2 3 5   Registration 0 3 3  Attention/ Calculation 0 0 0  Recall 2 1 0  Language- name 2 objects 0 2 2  Language- repeat - 0 1  Language- follow 3 step command - 3 3  Language- read & follow direction - 0 0  Write a sentence - 0 0  Copy design - 0 0  Total score - 14 17    No flowsheet data found.     Movement examination: Tone: There is normal tone in the UE/LE Abnormal movements:  no tremor.  No myoclonus.  No asterixis.   Coordination:  There is no decremation with RAM's. Normal finger to nose  Gait and Station: The patient has no difficulty arising out of a deep-seated chair without the use of the hands. The patient's stride length is good.  Gait is cautious and narrow.      Total time spent on today's visit was 40  minutes, including both face-to-face time and nonface-to-face time. Time included that spent on review of records (prior notes available to me/labs/imaging if pertinent), discussing treatment and goals,  answering patient's questions and coordinating care.  Cc:  Vidal Schwalbe, MD Sharene Butters, PA-C

## 2021-07-16 ENCOUNTER — Encounter: Payer: Self-pay | Admitting: Physician Assistant

## 2021-07-16 ENCOUNTER — Ambulatory Visit: Payer: Medicare HMO | Admitting: Physician Assistant

## 2021-07-16 ENCOUNTER — Other Ambulatory Visit: Payer: Self-pay

## 2021-07-16 VITALS — BP 151/75 | HR 61 | Resp 18 | Ht 69.0 in | Wt 175.0 lb

## 2021-07-16 DIAGNOSIS — F03B18 Unspecified dementia, moderate, with other behavioral disturbance: Secondary | ICD-10-CM

## 2021-07-16 MED ORDER — SERTRALINE HCL 25 MG PO TABS: 25.0000 mg | ORAL_TABLET | Freq: Every day | ORAL | 6 refills | Status: DC

## 2021-07-16 NOTE — Patient Instructions (Addendum)
°  2. Continue all medications as prescribed   3. Recommend joining your senior center or [employer], or joining day programs such as [facility]  4. Follow-up in 6 months, call for any changes   FALL PRECAUTIONS: Be cautious when walking. Scan the area for obstacles that may increase the risk of trips and falls. When getting up in the mornings, sit up at the edge of the bed for a few minutes before getting out of bed. Consider elevating the bed at the head end to avoid drop of blood pressure when getting up. Walk always in a well-lit room (use night lights in the walls). Avoid area rugs or power cords from appliances in the middle of the walkways. Use a walker or a cane if necessary and consider physical therapy for balance exercise. Get your eyesight checked regularly.   HOME SAFETY: Consider the safety of the kitchen when operating appliances like stoves, microwave oven, and blender. Consider having supervision and share cooking responsibilities until no longer able to participate in those. Accidents with firearms and other hazards in the house should be identified and addressed as well.  ABILITY TO BE LEFT ALONE: If patient is unable to contact 911 operator, consider using LifeLine, or when the need is there, arrange for someone to stay with patients. Smoking is a fire hazard, consider supervision or cessation. Risk of wandering should be assessed by caregiver and if detected at any point, supervision and safe proof recommendations should be instituted.   RECOMMENDATIONS FOR ALL PATIENTS WITH MEMORY PROBLEMS: 1. Continue to exercise (Recommend 30 minutes of walking everyday, or 3 hours every week) 2. Increase social interactions - continue going to [place] and enjoy social gatherings with friends and family 3. Eat healthy, avoid fried foods and eat more fruits and vegetables 4. Maintain adequate blood pressure, blood sugar, and blood cholesterol level. Reducing the risk of stroke and  cardiovascular disease also helps promoting better memory. 5. Avoid stressful situations. Live a simple life and avoid aggravations. Organize your time and prepare for the next day in anticipation. 6. Sleep well, avoid any interruptions of sleep and avoid any distractions in the bedroom that may interfere with adequate sleep quality 7. Avoid sugar, avoid sweets as there is a strong link between excessive sugar intake, diabetes, and cognitive impairment The Mediterranean diet has been shown to help patients reduce the risk of progressive memory disorders and reduces cardiovascular risk. This includes eating fish, eat fruits and green leafy vegetables, nuts like almonds and hazelnuts, walnuts, and also use olive oil. Avoid fast foods and fried foods as much as possible. Avoid sweets and sugar as sugar use has been linked to worsening of memory function.  There is always a concern of gradual progression of memory problems. If this is the case, then we may need to adjust level of care according to patient needs. Support, both to the patient and caregiver, should then be put into place.

## 2021-11-01 ENCOUNTER — Other Ambulatory Visit: Payer: Self-pay | Admitting: Neurology

## 2021-12-09 ENCOUNTER — Other Ambulatory Visit: Payer: Self-pay | Admitting: Neurology

## 2022-01-12 ENCOUNTER — Ambulatory Visit: Payer: Medicare HMO | Admitting: Physician Assistant

## 2022-01-12 ENCOUNTER — Ambulatory Visit: Payer: Medicare HMO | Admitting: Neurology

## 2022-01-12 ENCOUNTER — Encounter: Payer: Self-pay | Admitting: Physician Assistant

## 2022-01-12 VITALS — BP 120/69 | HR 71 | Resp 20 | Ht 71.0 in | Wt 177.0 lb

## 2022-01-12 DIAGNOSIS — F03B18 Unspecified dementia, moderate, with other behavioral disturbance: Secondary | ICD-10-CM | POA: Diagnosis not present

## 2022-01-12 NOTE — Patient Instructions (Signed)
It was a pleasure to see you today at our office.   Recommendations:  Follow up in   months Continue donepezil 10 mg daily. Side effects were discussed  Continue Memantine 10 mg twice daily.Side effects were discussed   Whom to call:  Memory  decline, memory medications: Call our office [phone]   For psychiatric meds, mood meds: Please have your primary care physician manage these medications.   Counseling regarding caregiver distress, including caregiver depression, anxiety and issues regarding community resources, adult day care programs, adult living facilities, or memory care questions:   Feel free to contact Misty Lisabeth Register, Social Worker at [phone]   For assessment of decision of mental capacity and competency:  Call Dr. Erick Blinks, geriatric psychiatrist at [phone]  For guidance in geriatric dementia issues please call [facility] [phone]  For guidance regarding [facility] and if placement were needed at the facility, contact Sidney Ace, Social Worker tel: [phone]  If you have any severe symptoms of a stroke, or other severe issues such as confusion,severe chills or fever, etc call 911 or go to the ER as you may need to be evaluated further   Feel free to visit Facebook page " [email or web address]" for tips of how to care for people with memory problems.        RECOMMENDATIONS FOR ALL PATIENTS WITH MEMORY PROBLEMS: 1. Continue to exercise (Recommend 30 minutes of walking everyday, or 3 hours every week) 2. Increase social interactions - continue going to [place] and enjoy social gatherings with friends and family 3. Eat healthy, avoid fried foods and eat more fruits and vegetables 4. Maintain adequate blood pressure, blood sugar, and blood cholesterol level. Reducing the risk of stroke and cardiovascular disease also helps promoting better memory. 5. Avoid stressful situations. Live a simple life and avoid  aggravations. Organize your time and prepare for the next day in anticipation. 6. Sleep well, avoid any interruptions of sleep and avoid any distractions in the bedroom that may interfere with adequate sleep quality 7. Avoid sugar, avoid sweets as there is a strong link between excessive sugar intake, diabetes, and cognitive impairment We discussed the Mediterranean diet, which has been shown to help patients reduce the risk of progressive memory disorders and reduces cardiovascular risk. This includes eating fish, eat fruits and green leafy vegetables, nuts like almonds and hazelnuts, walnuts, and also use olive oil. Avoid fast foods and fried foods as much as possible. Avoid sweets and sugar as sugar use has been linked to worsening of memory function.  There is always a concern of gradual progression of memory problems. If this is the case, then we may need to adjust level of care according to patient needs. Support, both to the patient and caregiver, should then be put into place.    The Alzheimer's Association is here all day, every day for people facing Alzheimer's disease through our free 24/7 Helpline: 5100136063. The Helpline provides reliable information and support to all those who need assistance, such as individuals living with memory loss, Alzheimer's or other dementia, caregivers, health care professionals and the public.  Our highly trained and knowledgeable staff can help you with: Understanding memory loss, dementia and Alzheimer's  Medications and other treatment options  General information about aging and brain health  Skills to provide quality care and to find the best care from professionals  Legal, financial and living-arrangement decisions Our Helpline also features: Confidential care consultation provided by master's level clinicians who  can help with decision-making support, crisis assistance and education on issues families face every day  Help in a caller's preferred  language using our translation service that features more than 200 languages and dialects  Referrals to local community programs, services and ongoing support     FALL PRECAUTIONS: Be cautious when walking. Scan the area for obstacles that may increase the risk of trips and falls. When getting up in the mornings, sit up at the edge of the bed for a few minutes before getting out of bed. Consider elevating the bed at the head end to avoid drop of blood pressure when getting up. Walk always in a well-lit room (use night lights in the walls). Avoid area rugs or power cords from appliances in the middle of the walkways. Use a walker or a cane if necessary and consider physical therapy for balance exercise. Get your eyesight checked regularly.  FINANCIAL OVERSIGHT: Supervision, especially oversight when making financial decisions or transactions is also recommended.  HOME SAFETY: Consider the safety of the kitchen when operating appliances like stoves, microwave oven, and blender. Consider having supervision and share cooking responsibilities until no longer able to participate in those. Accidents with firearms and other hazards in the house should be identified and addressed as well.   ABILITY TO BE LEFT ALONE: If patient is unable to contact 911 operator, consider using LifeLine, or when the need is there, arrange for someone to stay with patients. Smoking is a fire hazard, consider supervision or cessation. Risk of wandering should be assessed by caregiver and if detected at any point, supervision and safe proof recommendations should be instituted.  MEDICATION SUPERVISION: Inability to self-administer medication needs to be constantly addressed. Implement a mechanism to ensure safe administration of the medications.   DRIVING: Regarding driving, in patients with progressive memory problems, driving will be impaired. We advise to have someone else do the driving if trouble finding directions or if  minor accidents are reported. Independent driving assessment is available to determine safety of driving.   If you are interested in the driving assessment, you can contact the following:  [employer] in [place]  [place] [phone] or [phone]      [place] refers to food and lifestyle choices that are based on the traditions of countries located on the [employer]. This way of eating has been shown to help prevent certain conditions and improve outcomes for people who have chronic diseases, like kidney disease and heart disease. What are tips for following this plan? Lifestyle  Cook and eat meals together with your family, when possible. Drink enough fluid to keep your urine clear or pale yellow. Be physically active every day. This includes: Aerobic exercise like running or swimming. Leisure activities like gardening, walking, or housework. Get 7-8 hours of sleep each night. If recommended by your health care provider, drink red wine in moderation. This means 1 glass a day for nonpregnant women and 2 glasses a day for men. A glass of wine equals 5 oz (150 mL). Reading food labels  Check the serving size of packaged foods. For foods such as rice and pasta, the serving size refers to the amount of cooked product, not dry. Check the total fat in packaged foods. Avoid foods that have saturated fat or trans fats. Check the ingredients list for added sugars, such as corn syrup. Shopping  At [employer],  buy most of your food from the areas near the walls of the store. This includes: Fresh fruits and vegetables (produce). Grains, beans, nuts, and seeds. Some of these may be available in unpackaged forms or large amounts (in bulk). Fresh seafood. Poultry and eggs. Low-fat dairy products. Buy whole ingredients instead of  prepackaged foods. Buy fresh fruits and vegetables in-season from local farmers markets. Buy frozen fruits and vegetables in resealable bags. If you do not have access to quality fresh seafood, buy precooked frozen shrimp or canned fish, such as tuna, salmon, or sardines. Buy small amounts of raw or cooked vegetables, salads, or olives from the deli or salad bar at your store. Stock your pantry so you always have certain foods on hand, such as olive oil, canned tuna, canned tomatoes, rice, pasta, and beans. Cooking  Cook foods with extra-virgin olive oil instead of using butter or other vegetable oils. Have meat as a side dish, and have vegetables or grains as your main dish. This means having meat in small portions or adding small amounts of meat to foods like pasta or stew. Use beans or vegetables instead of meat in common dishes like chili or lasagna. Experiment with different cooking methods. Try roasting or broiling vegetables instead of steaming or sauteing them. Add frozen vegetables to soups, stews, pasta, or rice. Add nuts or seeds for added healthy fat at each meal. You can add these to yogurt, salads, or vegetable dishes. Marinate fish or vegetables using olive oil, lemon juice, garlic, and fresh herbs. Meal planning  Plan to eat 1 vegetarian meal one day each week. Try to work up to 2 vegetarian meals, if possible. Eat seafood 2 or more times a week. Have healthy snacks readily available, such as: Vegetable sticks with hummus. Greek yogurt. Fruit and nut trail mix. Eat balanced meals throughout the week. This includes: Fruit: 2-3 servings a day Vegetables: 4-5 servings a day Low-fat dairy: 2 servings a day Fish, poultry, or lean meat: 1 serving a day Beans and legumes: 2 or more servings a week Nuts and seeds: 1-2 servings a day Whole grains: 6-8 servings a day Extra-virgin olive oil: 3-4 servings a day Limit red meat and sweets to only a few servings a month What are my  food choices? Mediterranean diet Recommended Grains: Whole-grain pasta. Brown rice. [place] wheat. Polenta. Couscous. Whole-wheat bread. [place]. Vegetables: Artichokes. Beets. Broccoli. Cabbage. Carrots. Eggplant. Green beans. Chard. Kale. Spinach. Onions. Leeks. Peas. Squash. Tomatoes. Peppers. Radishes. Fruits: Apples. Apricots. Avocado. Berries. Bananas. Cherries. Dates. Figs. Grapes. Lemons. Melon. Oranges. Peaches. Plums. Pomegranate. Meats and other protein foods: Beans. Almonds. Sunflower seeds. Pine nuts. Peanuts. [place]. Salmon. Scallops. Shrimp. Towns. Tilapia. Clams. Oysters. Eggs. Dairy: Low-fat milk. Cheese. Greek yogurt. Beverages: Water. Red wine. Herbal tea. Fats and oils: Extra virgin olive oil. Avocado oil. Grape seed oil. Sweets and desserts: [place] yogurt with honey. Baked apples. Poached pears. Trail mix. Seasoning and other foods: Basil. Cilantro. Coriander. Cumin. Mint. Parsley. Sage. Rosemary. Tarragon. Garlic. Oregano. Thyme. Pepper. Balsalmic vinegar. Tahini. Hummus. Tomato sauce. Olives. Mushrooms. Limit these Grains: Prepackaged pasta or rice dishes. Prepackaged cereal with added sugar. Vegetables: Deep fried potatoes (french fries). Fruits: Fruit canned in syrup. Meats and other protein foods: Beef. Pork. Lamb. Poultry with skin. Hot dogs. [place] Salines. Dairy: Ice cream. Sour cream. Whole milk. Beverages: Juice. Sugar-sweetened soft drinks. Beer. Liquor and spirits. Fats and oils: Butter. Canola oil. Vegetable oil. Beef fat (tallow). Lard. Sweets and desserts: Cookies. Cakes. Pies. Candy. Seasoning  and other foods: Mayonnaise. Premade sauces and marinades. The items listed may not be a complete list. Talk with your dietitian about what dietary choices are right for you. Summary The Mediterranean diet includes both food and lifestyle choices. Eat a variety of fresh fruits and vegetables, beans, nuts, seeds, and whole grains. Limit the amount of red meat and sweets  that you eat. Talk with your health care provider about whether it is safe for you to drink red wine in moderation. This means 1 glass a day for nonpregnant women and 2 glasses a day for men. A glass of wine equals 5 oz (150 mL). This information is not intended to replace advice given to you by your health care provider. Make sure you discuss any questions you have with your health care provider. Document Released: 01/14/2016 Document Revised: 02/16/2016 Document Reviewed: 01/14/2016 Elsevier Interactive Patient Education  2017 [employer].

## 2022-01-12 NOTE — Progress Notes (Signed)
Assessment/Plan:   Dementia likely due to  Craig Sims is a very pleasant 81 y.o. RH male seen today in follow up for memory loss. Patient is currently on   Recommendations:    Continue donepezil 10 mg daily, memantine 10 mg twice daily.  Side effects were discussed Continue sertraline 25 mg daily, quetiapine 25 mg nightly for mood control. Follow up in 6 months.   Case discussed with Dr. Karel Jarvis who agrees with the plan  Needs someone  to be with him. Nees more help w bathing or hanging Craig Sims want to keep the lights on Someone is in my home, they dont talk or see them  People trying to kill him with food     Subjective:    This patient is accompanied in the office by  who supplements the history.  Previous records as well as any outside records available were reviewed prior to todays visit.    Any changes in memory since last visit? Ain't too good  Patient lives with:  repeats oneself?  Endorsed Disoriented when walking into a room?  Patient denies   Leaving objects in unusual places?  Patient denies   Ambulates  with difficulty?   Patient denies   Recent falls?  Patient denies   Any head injuries?  Patient denies   History of seizures?   Patient denies   Wandering behavior?  Patient denies   Patient drives?   Patient no longer drives  Any mood changes since last visit?  Patient denies   Any worsening depression?:  Patient denies   Hallucinations?  Patient denies   Paranoia?  Patient denies   Patient reports that male  sleeps well without vivid dreams, REM behavior or sleepwalking   History of sleep apnea?  Patient denies   Any hygiene concerns?  Patient denies   Independent of bathing and dressing?  Endorsed  Does the patient needs help with medications?  Denies Who is in charge of the finances?   is in charge    Any changes in appetite?  Patient denies   Patient have trouble swallowing? Patient denies   Does the patient cook?  Patient denies   Any kitchen  accidents such as leaving the stove on? Patient denies   Any headaches?  Patient denies   Double vision? Patient denies   Any focal numbness or tingling?  Patient denies   Chronic back pain Patient denies   Unilateral weakness?  Patient denies   Any tremors?  Patient denies   Any history of anosmia?  Patient denies   Any incontinence of urine?  Patient denies   Any bowel dysfunction?   Patient denies        History on Initial Assessment 04/25/2018: This is a 82 year old right-handed man with a history of hypertension, hyperlipidemia, diabetes, presenting for evaluation of dementia. Craig Sims feels Craig Sims memory is "fair, can't remember like before." Craig Sims lives alone. Craig Sims sons started noticing changes around a year ago, they would have conversations and Craig Sims would drift off to a different topic. Craig Sims sons report that over the past 6 months, Craig Sims would be awake at night turning all the lights with visual hallucinations. Last night Craig Sims saw a little child in a corner with red shoes on. Hallucinations are mostly in the evening, they report Craig Sims is okay during the daytime. Craig Sims is easily agitated. Craig Sims started having paranoia 2 months ago, thinking someone is trying to come in the house or someone is inside the  house. Craig Sims rarely drives but drove a week ago, denies getting lost. Craig Sims son has always managed Craig Sims bills. Craig Sims sons have started fixing Craig Sims pillbox and making sure Craig Sims takes Craig Sims medications. Sons have started staying with him for the past week due to the worsening hallucinations. Craig Sims family brought him to the ER yesterday due to worsening symptoms, Craig Sims had a head CT without contrast which I personally reviewed, no acute changes, there was mild diffuse atrophy and chronic microvascular disease.   Craig Sims has occasional left hand tremors. Craig Sims has occasional headaches and dizziness when standing. Craig Sims denies any diplopia, dysarthria/dysphagia, neck/back pain, focal numbness/tingling/weakness, bowel/bladder dysfunction. Craig Sims has noticed  reduced sense of smell. Craig Sims naps during the day and gets fidgety at night, but does not wander outside the house. No family history of dementia. No history of significant head injuries or alcohol use.     PREVIOUS MEDICATIONS:   CURRENT MEDICATIONS:  Outpatient Encounter Medications as of 01/12/2022  Medication Sig   amLODipine (NORVASC) 5 MG tablet Take 5 mg by mouth daily.   atorvastatin (LIPITOR) 40 MG tablet Take 20 mg by mouth daily.    Cholecalciferol (VITAMIN D3) 50 MCG (2000 UT) TABS Take 2,000 Units by mouth 2 (two) times daily.   donepezil (ARICEPT) 10 MG tablet TAKE 1 TABLET EVERY NIGHT   finasteride (PROSCAR) 5 MG tablet Take 5 mg by mouth daily.   lisinopril (PRINIVIL,ZESTRIL) 5 MG tablet Take 10 mg by mouth daily.   memantine (NAMENDA) 10 MG tablet TAKE 1 TABLET TWICE DAILY   QUEtiapine (SEROQUEL) 25 MG tablet TAKE 1 TABLET EVERY NIGHT.   sertraline (ZOLOFT) 25 MG tablet Take 1 tablet (25 mg total) by mouth daily.   tamsulosin (FLOMAX) 0.4 MG CAPS capsule Take 0.4 mg by mouth 2 (two) times daily.   No facility-administered encounter medications on file as of 01/12/2022.       07/16/2021    1:00 PM 12/12/2018   10:00 AM 04/25/2018    1:00 PM  MMSE - Mini Mental State Exam  Not completed: Refused    Orientation to time 0 2 3  Orientation to Place 2 3 5   Registration 0 3 3  Attention/ Calculation 0 0 0  Recall 2 1 0  Language- name 2 objects 0 2 2  Language- repeat  0 1  Language- follow 3 step command  3 3  Language- read & follow direction  0 0  Write a sentence  0 0  Copy design  0 0  Total score  14 17       No data to display          Objective:     PHYSICAL EXAMINATION:    VITALS:  There were no vitals filed for this visit.  GEN:  The patient appears stated age and is in NAD. HEENT:  Normocephalic, atraumatic.   Neurological examination:  General: NAD, well-groomed, appears stated age. Orientation: The patient is alert. Oriented to person, place  and date Cranial nerves: There is good facial symmetry.The speech is fluent and clear. No aphasia or dysarthria. Fund of knowledge is appropriate. Recent and remote memory are impaired. Attention and concentration are reduced.  Able to name objects and repeat phrases.  Hearing is intact to conversational tone.    Sensation: Sensation is intact to light touch throughout Motor: Strength is at least antigravity x4. Tremors: none  DTR's 2/4 in UE/LE     Movement examination: Tone: There is normal tone in  the UE/LE Abnormal movements:  no tremor.  No myoclonus.  No asterixis.   Coordination:  There is no decremation with RAM's. Normal finger to nose  Gait and Station: The patient has no difficulty arising out of a deep-seated chair without the use of the hands. The patient's stride length is good.  Gait is cautious and narrow.    Thank you for allowing [place] the opportunity to participate in the care of this nice patient. Please do not hesitate to contact us for any questions or concerns.   Total time spent on today's visit was *** minutes dedicated to this patient today, preparing to see patient, examining the patient, ordering tests and/or medications and counseling the patient, documenting clinical information in the EHR or other health record, independently interpreting results and communicating results to the patient/family, discussing treatment and goals, answering patient's questions and coordinating care.  Cc:  Smith Robert, MD  Marlowe Kays 01/12/2022 7:39 AM

## 2022-01-26 ENCOUNTER — Other Ambulatory Visit: Payer: Self-pay | Admitting: Neurology

## 2022-01-26 DIAGNOSIS — F03B18 Unspecified dementia, moderate, with other behavioral disturbance: Secondary | ICD-10-CM

## 2022-01-27 ENCOUNTER — Other Ambulatory Visit: Payer: Self-pay

## 2022-01-27 MED ORDER — SERTRALINE HCL 25 MG PO TABS: 25.0000 mg | ORAL_TABLET | Freq: Every day | ORAL | 1 refills | Status: DC

## 2022-01-31 ENCOUNTER — Other Ambulatory Visit: Payer: Self-pay | Admitting: Physician Assistant

## 2022-03-03 ENCOUNTER — Other Ambulatory Visit: Payer: Self-pay

## 2022-03-03 DIAGNOSIS — F03B18 Unspecified dementia, moderate, with other behavioral disturbance: Secondary | ICD-10-CM

## 2022-03-03 MED ORDER — QUETIAPINE FUMARATE 25 MG PO TABS: ORAL_TABLET | ORAL | 1 refills | Status: DC

## 2022-03-07 ENCOUNTER — Other Ambulatory Visit: Payer: Self-pay

## 2022-03-07 MED ORDER — SERTRALINE HCL 25 MG PO TABS: 25.0000 mg | ORAL_TABLET | Freq: Every day | ORAL | 1 refills | Status: DC

## 2022-03-22 ENCOUNTER — Other Ambulatory Visit: Payer: Self-pay | Admitting: Physician Assistant

## 2022-05-16 ENCOUNTER — Other Ambulatory Visit: Payer: Self-pay | Admitting: Physician Assistant

## 2022-05-16 NOTE — Telephone Encounter (Signed)
Pt's son called requesting refill on his father's medication Sertraline 25 mg. He would like a refill be sent to this pharmacy [facility] on [address]. States he only has 3 pills left and his medication comes through mail order. Pt requests call back.

## 2022-07-06 ENCOUNTER — Other Ambulatory Visit: Payer: Self-pay | Admitting: Physician Assistant

## 2022-07-12 ENCOUNTER — Ambulatory Visit: Payer: Medicare HMO | Admitting: Physician Assistant

## 2022-07-12 ENCOUNTER — Encounter: Payer: Self-pay | Admitting: Physician Assistant

## 2022-07-12 DIAGNOSIS — F03B18 Unspecified dementia, moderate, with other behavioral disturbance: Secondary | ICD-10-CM | POA: Diagnosis not present

## 2022-07-12 MED ORDER — QUETIAPINE FUMARATE 25 MG PO TABS: ORAL_TABLET | ORAL | 3 refills | Status: DC

## 2022-07-12 MED ORDER — DONEPEZIL HCL 10 MG PO TABS: ORAL_TABLET | ORAL | 3 refills | Status: DC

## 2022-07-12 MED ORDER — SERTRALINE HCL 50 MG PO TABS: 50.0000 mg | ORAL_TABLET | Freq: Every day | ORAL | 3 refills | Status: DC

## 2022-07-12 MED ORDER — MEMANTINE HCL 10 MG PO TABS: 10.0000 mg | ORAL_TABLET | Freq: Two times a day (BID) | ORAL | 3 refills | Status: DC

## 2022-07-12 NOTE — Patient Instructions (Signed)
It was a pleasure to see you today at our office.   Recommendations:  Follow up in 6  months Continue donepezil 10 mg daily.   Continue Memantine 10 mg twice daily. Continue Quetiapine 25 mg at night  Increase sertraline ( zoloft) to 50 mg daily for mood   Whom to call:  Memory  decline, memory medications: Call our office [phone]   For psychiatric meds, mood meds: Please have your primary care physician manage these medications.   Counseling regarding caregiver distress, including caregiver depression, anxiety and issues regarding community resources, adult day care programs, adult living facilities, or memory care questions:   Feel free to contact Lyman, Social Worker at [phone]   For assessment of decision of mental capacity and competency:  Call Dr. Anthoney Harada, geriatric psychiatrist at [phone]  For guidance in geriatric dementia issues please call [facility] [phone]  For guidance regarding [facility] and if placement were needed at the facility, contact Arnell Asal, Social Worker tel: [phone]  If you have any severe symptoms of a stroke, or other severe issues such as confusion,severe chills or fever, etc call 911 or go to the ER as you may need to be evaluated further   Feel free to visit Facebook page " [email or web address]" for tips of how to care for people with memory problems.        RECOMMENDATIONS FOR ALL PATIENTS WITH MEMORY PROBLEMS: 1. Continue to exercise (Recommend 30 minutes of walking everyday, or 3 hours every week) 2. Increase social interactions - continue going to [place] and enjoy social gatherings with friends and family 3. Eat healthy, avoid fried foods and eat more fruits and vegetables 4. Maintain adequate blood pressure, blood sugar, and blood cholesterol level. Reducing the risk of stroke and cardiovascular disease also helps promoting better memory. 5. Avoid stressful situations.  Live a simple life and avoid aggravations. Organize your time and prepare for the next day in anticipation. 6. Sleep well, avoid any interruptions of sleep and avoid any distractions in the bedroom that may interfere with adequate sleep quality 7. Avoid sugar, avoid sweets as there is a strong link between excessive sugar intake, diabetes, and cognitive impairment We discussed the Mediterranean diet, which has been shown to help patients reduce the risk of progressive memory disorders and reduces cardiovascular risk. This includes eating fish, eat fruits and green leafy vegetables, nuts like almonds and hazelnuts, walnuts, and also use olive oil. Avoid fast foods and fried foods as much as possible. Avoid sweets and sugar as sugar use has been linked to worsening of memory function.  There is always a concern of gradual progression of memory problems. If this is the case, then we may need to adjust level of care according to patient needs. Support, both to the patient and caregiver, should then be put into place.    The Alzheimer's Association is here all day, every day for people facing Alzheimer's disease through our free 24/7 Helpline: [phone]. The Helpline provides reliable information and support to all those who need assistance, such as individuals living with memory loss, Alzheimer's or other dementia, caregivers, health care professionals and the public.  Our highly trained and knowledgeable staff can help you with: Understanding memory loss, dementia and Alzheimer's  Medications and other treatment options  General information about aging and brain health  Skills to provide quality care and to find the best care from professionals  Legal, financial and living-arrangement decisions Our Helpline  also features: Confidential care consultation provided by master's level clinicians who can help with decision-making support, crisis assistance and education on issues families face every day   Help in a caller's preferred language using our translation service that features more than 200 languages and dialects  Referrals to local community programs, services and ongoing support     FALL PRECAUTIONS: Be cautious when walking. Scan the area for obstacles that may increase the risk of trips and falls. When getting up in the mornings, sit up at the edge of the bed for a few minutes before getting out of bed. Consider elevating the bed at the head end to avoid drop of blood pressure when getting up. Walk always in a well-lit room (use night lights in the walls). Avoid area rugs or power cords from appliances in the middle of the walkways. Use a walker or a cane if necessary and consider physical therapy for balance exercise. Get your eyesight checked regularly.  FINANCIAL OVERSIGHT: Supervision, especially oversight when making financial decisions or transactions is also recommended.  HOME SAFETY: Consider the safety of the kitchen when operating appliances like stoves, microwave oven, and blender. Consider having supervision and share cooking responsibilities until no longer able to participate in those. Accidents with firearms and other hazards in the house should be identified and addressed as well.   ABILITY TO BE LEFT ALONE: If patient is unable to contact 911 operator, consider using LifeLine, or when the need is there, arrange for someone to stay with patients. Smoking is a fire hazard, consider supervision or cessation. Risk of wandering should be assessed by caregiver and if detected at any point, supervision and safe proof recommendations should be instituted.  MEDICATION SUPERVISION: Inability to self-administer medication needs to be constantly addressed. Implement a mechanism to ensure safe administration of the medications.   DRIVING: Regarding driving, in patients with progressive memory problems, driving will be impaired. We advise to have someone else do the driving if  trouble finding directions or if minor accidents are reported. Independent driving assessment is available to determine safety of driving.   If you are interested in the driving assessment, you can contact the following:  [employer] in [place]  [place] [phone] or [phone]      [place] refers to food and lifestyle choices that are based on the traditions of countries located on the [employer]. This way of eating has been shown to help prevent certain conditions and improve outcomes for people who have chronic diseases, like kidney disease and heart disease. What are tips for following this plan? Lifestyle  Cook and eat meals together with your family, when possible. Drink enough fluid to keep your urine clear or pale yellow. Be physically active every day. This includes: Aerobic exercise like running or swimming. Leisure activities like gardening, walking, or housework. Get 7-8 hours of sleep each night. If recommended by your health care provider, drink red wine in moderation. This means 1 glass a day for nonpregnant women and 2 glasses a day for men. A glass of wine equals 5 oz (150 mL). Reading food labels  Check the serving size of packaged foods. For foods such as rice and pasta, the serving size refers to the amount of cooked product, not dry. Check the total fat in packaged foods. Avoid foods that have saturated fat or trans fats. Check the ingredients list for added  sugars, such as corn syrup. Shopping  At the grocery store, buy most of your food from the areas near the walls of the store. This includes: Fresh fruits and vegetables (produce). Grains, beans, nuts, and seeds. Some of these may be available in unpackaged forms or large amounts (in bulk). Fresh seafood. Poultry and eggs. Low-fat dairy  products. Buy whole ingredients instead of prepackaged foods. Buy fresh fruits and vegetables in-season from local farmers markets. Buy frozen fruits and vegetables in resealable bags. If you do not have access to quality fresh seafood, buy precooked frozen shrimp or canned fish, such as tuna, salmon, or sardines. Buy small amounts of raw or cooked vegetables, salads, or olives from the deli or salad bar at your store. Stock your pantry so you always have certain foods on hand, such as olive oil, canned tuna, canned tomatoes, rice, pasta, and beans. Cooking  Cook foods with extra-virgin olive oil instead of using butter or other vegetable oils. Have meat as a side dish, and have vegetables or grains as your main dish. This means having meat in small portions or adding small amounts of meat to foods like pasta or stew. Use beans or vegetables instead of meat in common dishes like chili or lasagna. Experiment with different cooking methods. Try roasting or broiling vegetables instead of steaming or sauteing them. Add frozen vegetables to soups, stews, pasta, or rice. Add nuts or seeds for added healthy fat at each meal. You can add these to yogurt, salads, or vegetable dishes. Marinate fish or vegetables using olive oil, lemon juice, garlic, and fresh herbs. Meal planning  Plan to eat 1 vegetarian meal one day each week. Try to work up to 2 vegetarian meals, if possible. Eat seafood 2 or more times a week. Have healthy snacks readily available, such as: Vegetable sticks with hummus. Greek yogurt. Fruit and nut trail mix. Eat balanced meals throughout the week. This includes: Fruit: 2-3 servings a day Vegetables: 4-5 servings a day Low-fat dairy: 2 servings a day Fish, poultry, or lean meat: 1 serving a day Beans and legumes: 2 or more servings a week Nuts and seeds: 1-2 servings a day Whole grains: 6-8 servings a day Extra-virgin olive oil: 3-4 servings a day Limit red meat and sweets  to only a few servings a month What are my food choices? Mediterranean diet Recommended Grains: Whole-grain pasta. Brown rice. Bulgar wheat. Polenta. Couscous. Whole-wheat bread. [place]. Vegetables: Artichokes. Beets. Broccoli. Cabbage. Carrots. Eggplant. Green beans. Chard. Kale. Spinach. Onions. Leeks. Peas. Squash. Tomatoes. Peppers. Radishes. Fruits: Apples. Apricots. Avocado. Berries. Bananas. Cherries. Dates. Figs. Grapes. Lemons. Melon. Oranges. Peaches. Plums. Pomegranate. Meats and other protein foods: Beans. Almonds. Sunflower seeds. Pine nuts. Peanuts. [place]. Salmon. Scallops. Shrimp. [place]. Tilapia. Clams. Oysters. Eggs. Dairy: Low-fat milk. Cheese. Greek yogurt. Beverages: Water. Red wine. Herbal tea. Fats and oils: Extra virgin olive oil. Avocado oil. Grape seed oil. Sweets and desserts: [place] yogurt with honey. Baked apples. Poached pears. Trail mix. Seasoning and other foods: Basil. Cilantro. Coriander. Cumin. Mint. Parsley. Sage. Rosemary. Tarragon. Garlic. Oregano. Thyme. Pepper. Balsalmic vinegar. Tahini. Hummus. Tomato sauce. Olives. Mushrooms. Limit these Grains: Prepackaged pasta or rice dishes. Prepackaged cereal with added sugar. Vegetables: Deep fried potatoes (french fries). Fruits: Fruit canned in syrup. Meats and other protein foods: Beef. Pork. Lamb. Poultry with skin. Hot dogs. [place] Salines. Dairy: Ice cream. Sour cream. Whole milk. Beverages: Juice. Sugar-sweetened soft drinks. Beer. Liquor and spirits. Fats and oils: Butter. Canola oil. Vegetable oil. Beef  fat (tallow). Lard. Sweets and desserts: Cookies. Cakes. Pies. Candy. Seasoning and other foods: Mayonnaise. Premade sauces and marinades. The items listed may not be a complete list. Talk with your dietitian about what dietary choices are right for you. Summary The Mediterranean diet includes both food and lifestyle choices. Eat a variety of fresh fruits and vegetables, beans, nuts, seeds, and whole  grains. Limit the amount of red meat and sweets that you eat. Talk with your health care provider about whether it is safe for you to drink red wine in moderation. This means 1 glass a day for nonpregnant women and 2 glasses a day for men. A glass of wine equals 5 oz (150 mL). This information is not intended to replace advice given to you by your health care provider. Make sure you discuss any questions you have with your health care provider. Document Released: 01/14/2016 Document Revised: 02/16/2016 Document Reviewed: 01/14/2016 Elsevier Interactive Patient Education  2017 [employer].

## 2022-07-12 NOTE — Progress Notes (Signed)
Assessment/Plan:   Dementia with behavioral disturbance  Craig Sims is a very pleasant 82 y.o. RH male with a history of hypertension, hyperlipidemia, diabetes seen today in follow up for memory loss. Patient is currently on donepezil 10 mg daily and memantine 10 mg twice daily, tolerating well.  He is also on sertraline 25 mg daily for mood control, and quetiapine 25 mg nightly.  Of note, there is a slight change in his mood, slightly more withdrawn and he is aware of his memory changes-cognitive decline which may have contributed to this.  He does not participate in social activities, and states most of the time at home watching TV.  Son says that he is not interested in doing so.  MMSE could not be performed today due to changes in his memory   Recommendations Follow up in 6  months. Continue donepezil 10 mg daily. Side effects were discussed  Continue Memantine 10 mg twice daily. Side effects were discussed  Increase sertraline to 50 mg daily, may increase to 100 mg daily if needed, someone will contact us. Continue quetiapine 25 mg nightly for hallucinations.   Subjective:    This patient is accompanied in the office by his son who supplements the history.  Previous records as well as any outside records available were reviewed prior to todays visit. Patient was last seen on  01/12/22   Any changes in memory since last visit? "Is bad" Patient has  difficulty remembering recent conversations and people names. Not as conversant as before.Watches TV most of the day and does not participate in any social activities, he is not interested. repeats oneself?  Endorsed, "not more than before " Disoriented when walking into a room?  Endorsed  Leaving objects in unusual places?    denies   Wandering behavior?  Endorsed.  He goes to the neighbor and knocks the door, and then he has to be taken back home because he is disoriented.   Any personality changes since last visit?  "Sometimes more  short tempered " Any worsening depression?:  "Not much" Hallucinations or paranoia?  Continues to see people that are not there, but they are not frightening to him. He still thinks he is working Seizures?    denies    Any sleep changes?  Sometimes he is up and walks a little around the house and goes back to bed . Sometimes he reports  vivid dreams, REM behavior or sleepwalking   Sleep apnea?   denies   Any hygiene concerns?    denies   Independent of bathing and dressing?  Occasionally son has to help him with buttoning the shirts and putting pants on Does the patient needs help with medications? Son  is in charge Who is in charge of the finances? Son  is in charge Any changes in appetite?  He does not eat as much as he used to , does not drink enough water Patient have trouble swallowing?  denies   Does the patient cook?  No   Any headaches?   denies   Chronic back pain  denies   Ambulates with difficulty?     denies   Recent falls or head injuries? denies     Unilateral weakness, numbness or tingling?    denies   Any tremors?  denies   Any anosmia?  Chronic decreased sense of smell Any incontinence of urine?  denies   Any bowel dysfunction?     denies  Patient lives  alone but children live nearby, and rotate to monitor him Does the patient drive? No longer drives    History on Initial Assessment 04/25/2018: This is a 82 year old right-handed man with a history of hypertension, hyperlipidemia, diabetes, presenting for evaluation of dementia. He feels his memory is "fair, can't remember like before." He lives alone. His sons started noticing changes around a year ago, they would have conversations and he would drift off to a different topic. His sons report that over the past 6 months, he would be awake at night turning all the lights with visual hallucinations. Last night he saw a little child in a corner with red shoes on. Hallucinations are mostly in the evening, they report he is  okay during the daytime. He is easily agitated. He started having paranoia 2 months ago, thinking someone is trying to come in the house or someone is inside the house. He rarely drives but drove a week ago, denies getting lost. His son has always managed his bills. His sons have started fixing his pillbox and making sure he takes his medications. Sons have started staying with him for the past week due to the worsening hallucinations. His family brought him to the ER yesterday due to worsening symptoms, he had a head CT without contrast which I personally reviewed, no acute changes, there was mild diffuse atrophy and chronic microvascular disease.   He has occasional left hand tremors. He has occasional headaches and dizziness when standing. He denies any diplopia, dysarthria/dysphagia, neck/back pain, focal numbness/tingling/weakness, bowel/bladder dysfunction. He has noticed reduced sense of smell. He naps during the day and gets fidgety at night, but does not wander outside the house. No family history of dementia. No history of significant head injuries or alcohol use. PREVIOUS MEDICATIONS:   CURRENT MEDICATIONS:  Outpatient Encounter Medications as of 07/12/2022  Medication Sig   amLODipine (NORVASC) 5 MG tablet Take 5 mg by mouth daily.   atorvastatin (LIPITOR) 40 MG tablet Take 20 mg by mouth daily.    Cholecalciferol (VITAMIN D3) 50 MCG (2000 UT) TABS Take 2,000 Units by mouth 2 (two) times daily.   finasteride (PROSCAR) 5 MG tablet Take 5 mg by mouth daily.   lisinopril (PRINIVIL,ZESTRIL) 5 MG tablet Take 10 mg by mouth daily.   tamsulosin (FLOMAX) 0.4 MG CAPS capsule Take 0.4 mg by mouth 2 (two) times daily.   [DISCONTINUED] donepezil (ARICEPT) 10 MG tablet TAKE 1 TABLET EVERY NIGHT   [DISCONTINUED] memantine (NAMENDA) 10 MG tablet TAKE 1 TABLET TWICE DAILY   [DISCONTINUED] QUEtiapine (SEROQUEL) 25 MG tablet Take 1 tab at night   [DISCONTINUED] sertraline (ZOLOFT) 25 MG tablet TAKE 1 TABLET  EVERY DAY   donepezil (ARICEPT) 10 MG tablet Take one tab daily   memantine (NAMENDA) 10 MG tablet Take 1 tablet (10 mg total) by mouth 2 (two) times daily.   QUEtiapine (SEROQUEL) 25 MG tablet Take 1 tab at night   sertraline (ZOLOFT) 50 MG tablet Take 1 tablet (50 mg total) by mouth daily.   No facility-administered encounter medications on file as of 07/12/2022.       07/16/2021    1:00 PM 12/12/2018   10:00 AM 04/25/2018    1:00 PM  MMSE - Mini Mental State Exam  Not completed: Refused    Orientation to time 0 2 3  Orientation to Place 2 3 5   Registration 0 3 3  Attention/ Calculation 0 0 0  Recall 2 1 0  Language- name 2 objects 0 2 2  Language- repeat  0 1  Language- follow 3 step command  3 3  Language- read & follow direction  0 0  Write a sentence  0 0  Copy design  0 0  Total score  14 17       No data to display          Objective:     PHYSICAL EXAMINATION:    VITALS:   Vitals:   07/12/22 0928  BP: 113/69  Pulse: 73  Resp: 18  SpO2: 97%  Weight: 173 lb (78.5 kg)  Height: 5\' 11"  (1.803 m)    GEN:  The patient appears stated age and is in NAD. HEENT:  Normocephalic, atraumatic.   Neurological examination:  General: NAD, well-groomed, appears stated age. Orientation: The patient is alert. Oriented to person, not to place or date Cranial nerves: There is good facial symmetry.The speech is fluent and clear but there is some hypophonia. No aphasia or dysarthria. Fund of knowledge is appropriate. Recent and remote memory are impaired. Attention and concentration are reduced.  Able to name objects and repeat phrases.  Hearing is intact to conversational tone.    Sensation: Sensation is intact to light touch throughout Motor: Strength is at least antigravity x4. DTR's 2/4 in UE/LE     Movement examination: Tone: There is normal tone in the UE/LE Abnormal movements:  no tremor.  No myoclonus.  No asterixis.   Coordination:  There is no decremation with  RAM's. Normal finger to nose  Gait and Station: The patient has no difficulty arising out of a deep-seated chair without the use of the hands.  He uses a right cane to ambulate.  The patient's stride length is good.  Gait is cautious and narrow.    Thank you for allowing [place] the opportunity to participate in the care of this nice patient. Please do not hesitate to contact us for any questions or concerns.   Total time spent on today's visit was 20 minutes dedicated to this patient today, preparing to see patient, examining the patient, ordering tests and/or medications and counseling the patient, documenting clinical information in the EHR or other health record, independently interpreting results and communicating results to the patient/family, discussing treatment and goals, answering patient's questions and coordinating care.  Cc:  Vidal Schwalbe, MD  Sharene Butters 07/12/2022 12:16 PM

## 2022-07-15 ENCOUNTER — Ambulatory Visit: Payer: Medicare HMO | Admitting: Physician Assistant

## 2023-01-11 ENCOUNTER — Ambulatory Visit: Payer: Medicare HMO | Admitting: Physician Assistant

## 2023-01-27 ENCOUNTER — Ambulatory Visit: Payer: Medicare HMO | Admitting: Physician Assistant

## 2023-02-01 ENCOUNTER — Ambulatory Visit: Payer: Medicare HMO | Admitting: Physician Assistant

## 2023-02-01 ENCOUNTER — Encounter: Payer: Self-pay | Admitting: Physician Assistant

## 2023-02-01 DIAGNOSIS — F03B18 Unspecified dementia, moderate, with other behavioral disturbance: Secondary | ICD-10-CM | POA: Diagnosis not present

## 2023-02-01 MED ORDER — SERTRALINE HCL 50 MG PO TABS: 50.0000 mg | ORAL_TABLET | Freq: Every day | ORAL | 3 refills | Status: DC

## 2023-02-01 MED ORDER — MEMANTINE HCL 10 MG PO TABS: 10.0000 mg | ORAL_TABLET | Freq: Two times a day (BID) | ORAL | 3 refills | Status: DC

## 2023-02-01 MED ORDER — QUETIAPINE FUMARATE 50 MG PO TABS: 50.0000 mg | ORAL_TABLET | Freq: Every day | ORAL | 3 refills | Status: DC

## 2023-02-01 NOTE — Progress Notes (Signed)
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 Patient is currently on donepezil 10 mg daily and memantine 10 mg twice daily. Cognitive decline is noted, but his son is concerned about his hallucinations. He is on Seroquel 25 mg at night, Zoloft 50 mg daily . Discussed increasing Seroquel, including black box warning. Son agreed to proceed with increasing this medicine. At some point we may increase donepezil to 23 mg daily, highest dose. He declines attending Adult Day Programs.    Follow up in 6  months. Continue donepezil 10 mg daily and memantine 10 mg twice daily, side effects discussed  Continue sertraline 100 mg daily and quetiapine 25 mg nightly Recommend good control of her cardiovascular risk factors Continue to control mood as per PCP     Subjective:    This patient is accompanied in the office by his own who supplements the history.  Previous records as well as any outside records available were reviewed prior to todays visit. Patient was last seen on 07/12/22   Any changes in memory since last visit? "  Worse than before, can't remember nothing ".  He has difficulty remembering any new information and names of people.  He is not conversant.  Watches TV most of the day, and does not participate in any social activities, has a lack of interest. repeats oneself?  Endorsed Disoriented when walking into a room?  Patient denies   Leaving objects in unusual places?  May misplace things but not in unusual places  Wandering behavior?  Denies. Any personality changes since last visit?  As before, "he gets short tempered at times " Any worsening depression?:  Denies.   Hallucinations or paranoia?  Denies.   He still believes he is working. He talks and sees people at night, usually people that he knows" Seizures? denies    Any sleep changes? Does not sleep as well as before, talking a lot a night."He gets up a lot"  Denies vivid dreams, REM behavior or sleepwalking   Sleep apnea?   Denies.   Any hygiene concerns? He needs to be reminded  Independent of bathing and dressing?  He needs help with getting dressed, his son has to put on his shirt and put pants on him Does the patient needs help with medications?  Son is in charge   Who is in charge of the finances?  Son is in charge     Any changes in appetite?  As before, he has decreased appetite, he does not drink enough water    Patient have trouble swallowing? Denies.   Does the patient cook? No Any headaches?   denies   Chronic back pain  denies   Ambulates with difficulty? Denies.   Recent falls or head injuries? Denies.     Unilateral weakness, numbness or tingling? Denies.   Any tremors?  Denies   Any anosmia?  He has chronic decreased sense of smell Any incontinence of urine? Denies  Any bowel dysfunction?   Denies      Patient lives alone with his children nearby who rotate to monitor him  Does the patient drive? No longer drives     History on Initial Assessment 04/25/2018: This is a 82 year old right-handed  man with a history of hypertension, hyperlipidemia, diabetes, presenting for evaluation of dementia. He feels his memory is "fair, can't remember like before." He lives alone. His sons started noticing changes around a year ago, they would have conversations and he would drift off to a different topic. His sons report that over the past 6 months, he would be awake at night turning all the lights with visual hallucinations. Last night he saw a little child in a corner with red shoes on. Hallucinations are mostly in the evening, they report he is okay during the daytime. He is easily agitated. He started having paranoia 2 months ago,  thinking someone is trying to come in the house or someone is inside the house. He rarely drives but drove a week ago, denies getting lost. His son has always managed his bills. His sons have started fixing his pillbox and making sure he takes his medications. Sons have started staying with him for the past week due to the worsening hallucinations. His family brought him to the ER yesterday due to worsening symptoms, he had a head CT without contrast which I personally reviewed, no acute changes, there was mild diffuse atrophy and chronic microvascular disease.   He has occasional left hand tremors. He has occasional headaches and dizziness when standing. He denies any diplopia, dysarthria/dysphagia, neck/back pain, focal numbness/tingling/weakness, bowel/bladder dysfunction. He has noticed reduced sense of smell. He naps during the day and gets fidgety at night, but does not wander outside the house. No family history of dementia. No history of significant head injuries or alcohol use.  PREVIOUS MEDICATIONS:   CURRENT MEDICATIONS:  Outpatient Encounter Medications as of 02/01/2023  Medication Sig   amLODipine (NORVASC) 5 MG tablet Take 5 mg by mouth daily.   atorvastatin (LIPITOR) 40 MG tablet Take 20 mg by mouth daily.    Cholecalciferol (VITAMIN D3) 50 MCG (2000 UT) TABS Take 2,000 Units by mouth 2 (two) times daily.   donepezil (ARICEPT) 10 MG tablet Take one tab daily   finasteride (PROSCAR) 5 MG tablet Take 5 mg by mouth daily.   lisinopril (PRINIVIL,ZESTRIL) 5 MG tablet Take 10 mg by mouth daily.   tamsulosin (FLOMAX) 0.4 MG CAPS capsule Take 0.4 mg by mouth 2 (two) times daily.   [DISCONTINUED] memantine (NAMENDA) 10 MG tablet Take 1 tablet (10 mg total) by mouth 2 (two) times daily.   [DISCONTINUED] QUEtiapine (SEROQUEL) 25 MG tablet Take 1 tab at night   [DISCONTINUED] sertraline (ZOLOFT) 50 MG tablet Take 1 tablet (50 mg total) by mouth daily.   memantine (NAMENDA) 10 MG tablet Take 1  tablet (10 mg total) by mouth 2 (two) times daily.   QUEtiapine (SEROQUEL) 50 MG tablet Take 1 tablet (50 mg total) by mouth at bedtime.   sertraline (ZOLOFT) 50 MG tablet Take 1 tablet (50 mg total) by mouth daily.   No facility-administered encounter medications on file as of 02/01/2023.       07/16/2021    1:00 PM 12/12/2018   10:00 AM 04/25/2018    1:00 PM  MMSE - Mini Mental State Exam  Not completed: Refused    Orientation to time 0 2 3  Orientation to Place 2 3 5   Registration 0 3 3  Attention/ Calculation 0 0 0  Recall 2 1 0  Language- name 2 objects 0 2 2  Language- repeat  0 1  Language- follow 3 step command  3 3  Language- read & follow direction  0  0  Write a sentence  0 0  Copy design  0 0  Total score  14 17       No data to display          Objective:     PHYSICAL EXAMINATION:    VITALS:   Vitals:   02/01/23 1433  BP: 112/63  Pulse: 80  Resp: 20  SpO2: 98%  Weight: 162 lb (73.5 kg)  Height: 5\' 11"  (1.803 m)    GEN:  The patient appears stated age and is in NAD. HEENT:  Normocephalic, atraumatic.   Neurological examination:  General: NAD, well-groomed, appears stated age. Orientation: The patient is alert. Oriented to person, not to place or  date Cranial nerves: There is good facial symmetry.The speech is fluent and clear. No aphasia or dysarthria. Fund of knowledge is reduced. Recent and remote memory are impaired. Attention and concentration are reduced.  Able to name objects and repeat phrases.  Hearing is intact to conversational tone. c Sensation: Sensation is intact to light touch throughout Motor: Strength is at least antigravity x4. DTR's 2/4 in UE/LE     Movement examination: Tone: There is normal tone in the UE/LE Abnormal movements:  no tremor.  No myoclonus.  No asterixis.   Coordination:  There is no decremation with RAM's. Normal finger to nose  Gait and Station: The patient has no difficulty arising out of a deep-seated  chair without the use of the hands. The patient's stride length is good.  Gait is cautious and narrow.    Thank you for allowing [place] the opportunity to participate in the care of this nice patient. Please do not hesitate to contact us for any questions or concerns.   Total time spent on today's visit was 34 minutes dedicated to this patient today, preparing to see patient, examining the patient, ordering tests and/or medications and counseling the patient, documenting clinical information in the EHR or other health record, independently interpreting results and communicating results to the patient/family, discussing treatment and goals, answering patient's questions and coordinating care.  Cc:  Smith Robert, MD  Marlowe Kays 02/01/2023 3:05 PM

## 2023-02-01 NOTE — Patient Instructions (Signed)
It was a pleasure to see you today at our office.   Recommendations:  Follow up in 6  months Continue donepezil 10 mg daily.   Continue Memantine 10 mg twice daily. Continue Quetiapine increase to 50 mg at night (you used to take 25 mg of the pill, now is 2x 25 mg pill=50 mg) Continue sertraline ( zoloft) to 50 mg daily for mood    Whom to call:  Memory  decline, memory medications: Call our office [phone]   For psychiatric meds, mood meds: Please have your primary care physician manage these medications.   Counseling regarding caregiver distress, including caregiver depression, anxiety and issues regarding community resources, adult day care programs, adult living facilities, or memory care questions:   Feel free to contact Misty Lisabeth Register, Social Worker at [phone]   For assessment of decision of mental capacity and competency:  Call Dr. Erick Blinks, geriatric psychiatrist at [phone]  For guidance in geriatric dementia issues please call [facility] [phone]  For guidance regarding [facility] and if placement were needed at the facility, contact Sidney Ace, Social Worker tel: [phone]  If you have any severe symptoms of a stroke, or other severe issues such as confusion,severe chills or fever, etc call 911 or go to the ER as you may need to be evaluated further   Feel free to visit Facebook page " [email or web address]" for tips of how to care for people with memory problems.        RECOMMENDATIONS FOR ALL PATIENTS WITH MEMORY PROBLEMS: 1. Continue to exercise (Recommend 30 minutes of walking everyday, or 3 hours every week) 2. Increase social interactions - continue going to [place] and enjoy social gatherings with friends and family 3. Eat healthy, avoid fried foods and eat more fruits and vegetables 4. Maintain adequate blood pressure, blood sugar, and blood cholesterol level. Reducing the risk of stroke and cardiovascular  disease also helps promoting better memory. 5. Avoid stressful situations. Live a simple life and avoid aggravations. Organize your time and prepare for the next day in anticipation. 6. Sleep well, avoid any interruptions of sleep and avoid any distractions in the bedroom that may interfere with adequate sleep quality 7. Avoid sugar, avoid sweets as there is a strong link between excessive sugar intake, diabetes, and cognitive impairment We discussed the Mediterranean diet, which has been shown to help patients reduce the risk of progressive memory disorders and reduces cardiovascular risk. This includes eating fish, eat fruits and green leafy vegetables, nuts like almonds and hazelnuts, walnuts, and also use olive oil. Avoid fast foods and fried foods as much as possible. Avoid sweets and sugar as sugar use has been linked to worsening of memory function.  There is always a concern of gradual progression of memory problems. If this is the case, then we may need to adjust level of care according to patient needs. Support, both to the patient and caregiver, should then be put into place.    The Alzheimer's Association is here all day, every day for people facing Alzheimer's disease through our free 24/7 Helpline: [phone]. The Helpline provides reliable information and support to all those who need assistance, such as individuals living with memory loss, Alzheimer's or other dementia, caregivers, health care professionals and the public.  Our highly trained and knowledgeable staff can help you with: Understanding memory loss, dementia and Alzheimer's  Medications and other treatment options  General information about aging and brain health  Skills to provide  quality care and to find the best care from professionals  Legal, financial and living-arrangement decisions Our Helpline also features: Confidential care consultation provided by master's level clinicians who can help with decision-making  support, crisis assistance and education on issues families face every day  Help in a caller's preferred language using our translation service that features more than 200 languages and dialects  Referrals to local community programs, services and ongoing support     FALL PRECAUTIONS: Be cautious when walking. Scan the area for obstacles that may increase the risk of trips and falls. When getting up in the mornings, sit up at the edge of the bed for a few minutes before getting out of bed. Consider elevating the bed at the head end to avoid drop of blood pressure when getting up. Walk always in a well-lit room (use night lights in the walls). Avoid area rugs or power cords from appliances in the middle of the walkways. Use a walker or a cane if necessary and consider physical therapy for balance exercise. Get your eyesight checked regularly.  FINANCIAL OVERSIGHT: Supervision, especially oversight when making financial decisions or transactions is also recommended.  HOME SAFETY: Consider the safety of the kitchen when operating appliances like stoves, microwave oven, and blender. Consider having supervision and share cooking responsibilities until no longer able to participate in those. Accidents with firearms and other hazards in the house should be identified and addressed as well.   ABILITY TO BE LEFT ALONE: If patient is unable to contact 911 operator, consider using LifeLine, or when the need is there, arrange for someone to stay with patients. Smoking is a fire hazard, consider supervision or cessation. Risk of wandering should be assessed by caregiver and if detected at any point, supervision and safe proof recommendations should be instituted.  MEDICATION SUPERVISION: Inability to self-administer medication needs to be constantly addressed. Implement a mechanism to ensure safe administration of the medications.   DRIVING: Regarding driving, in patients with progressive memory problems,  driving will be impaired. We advise to have someone else do the driving if trouble finding directions or if minor accidents are reported. Independent driving assessment is available to determine safety of driving.   If you are interested in the driving assessment, you can contact the following:  [facility] [employer] [facility] in [place] [phone]  [facility] [phone]  [facility] [phone] or [phone]      Mediterranean Diet A Mediterranean diet refers to food and lifestyle choices that are based on the traditions of countries located on the [employer]. This way of eating has been shown to help prevent certain conditions and improve outcomes for people who have chronic diseases, like kidney disease and heart disease. What are tips for following this plan? Lifestyle  Cook and eat meals together with your family, when possible. Drink enough fluid to keep your urine clear or pale yellow. Be physically active every day. This includes: Aerobic exercise like running or swimming. Leisure activities like gardening, walking, or housework. Get 7-8 hours of sleep each night. If recommended by your health care provider, drink red wine in moderation. This means 1 glass a day for nonpregnant women and 2 glasses a day for men. A glass of wine equals 5 oz (150 mL). Reading food labels  Check the serving size of packaged foods. For foods such as rice and pasta, the serving size refers to the amount of cooked product, not dry. Check the total fat  in packaged foods. Avoid foods that have saturated fat or trans fats. Check the ingredients list for added sugars, such as corn syrup. Shopping  At the grocery store, buy most of your food from the areas near the walls of the store. This includes: Fresh fruits and vegetables (produce). Grains, beans, nuts, and seeds. Some of these may be available in unpackaged forms or large  amounts (in bulk). Fresh seafood. Poultry and eggs. Low-fat dairy products. Buy whole ingredients instead of prepackaged foods. Buy fresh fruits and vegetables in-season from local farmers markets. Buy frozen fruits and vegetables in resealable bags. If you do not have access to quality fresh seafood, buy precooked frozen shrimp or canned fish, such as tuna, salmon, or sardines. Buy small amounts of raw or cooked vegetables, salads, or olives from the deli or salad bar at your store. Stock your pantry so you always have certain foods on hand, such as olive oil, canned tuna, canned tomatoes, rice, pasta, and beans. Cooking  Cook foods with extra-virgin olive oil instead of using butter or other vegetable oils. Have meat as a side dish, and have vegetables or grains as your main dish. This means having meat in small portions or adding small amounts of meat to foods like pasta or stew. Use beans or vegetables instead of meat in common dishes like chili or lasagna. Experiment with different cooking methods. Try roasting or broiling vegetables instead of steaming or sauteing them. Add frozen vegetables to soups, stews, pasta, or rice. Add nuts or seeds for added healthy fat at each meal. You can add these to yogurt, salads, or vegetable dishes. Marinate fish or vegetables using olive oil, lemon juice, garlic, and fresh herbs. Meal planning  Plan to eat 1 vegetarian meal one day each week. Try to work up to 2 vegetarian meals, if possible. Eat seafood 2 or more times a week. Have healthy snacks readily available, such as: Vegetable sticks with hummus. Greek yogurt. Fruit and nut trail mix. Eat balanced meals throughout the week. This includes: Fruit: 2-3 servings a day Vegetables: 4-5 servings a day Low-fat dairy: 2 servings a day Fish, poultry, or lean meat: 1 serving a day Beans and legumes: 2 or more servings a week Nuts and seeds: 1-2 servings a day Whole grains: 6-8 servings a  day Extra-virgin olive oil: 3-4 servings a day Limit red meat and sweets to only a few servings a month What are my food choices? Mediterranean diet Recommended Grains: Whole-grain pasta. Brown rice. Bulgar wheat. Polenta. Couscous. Whole-wheat bread. Orpah Cobb. Vegetables: Artichokes. Beets. Broccoli. Cabbage. Carrots. Eggplant. Green beans. Chard. Kale. Spinach. Onions. Leeks. Peas. Squash. Tomatoes. Peppers. Radishes. Fruits: Apples. Apricots. Avocado. Berries. Bananas. Cherries. Dates. Figs. Grapes. Lemons. Melon. Oranges. Peaches. Plums. Pomegranate. Meats and other protein foods: Beans. Almonds. Sunflower seeds. Pine nuts. Peanuts. Cod. Salmon. Scallops. Shrimp. Tuna. Tilapia. Clams. Oysters. Eggs. Dairy: Low-fat milk. Cheese. Greek yogurt. Beverages: Water. Red wine. Herbal tea. Fats and oils: Extra virgin olive oil. Avocado oil. Grape seed oil. Sweets and desserts: [place] yogurt with honey. Baked apples. Poached pears. Trail mix. Seasoning and other foods: Basil. Cilantro. Coriander. Cumin. Mint. Parsley. Sage. Rosemary. Tarragon. Garlic. Oregano. Thyme. Pepper. Balsalmic vinegar. Tahini. Hummus. Tomato sauce. Olives. Mushrooms. Limit these Grains: Prepackaged pasta or rice dishes. Prepackaged cereal with added sugar. Vegetables: Deep fried potatoes (french fries). Fruits: Fruit canned in syrup. Meats and other protein foods: Beef. Pork. Lamb. Poultry with skin. Hot dogs. Tomasa Blase. Dairy: Ice cream. Sour cream. Whole milk.  Beverages: Juice. Sugar-sweetened soft drinks. Beer. Liquor and spirits. Fats and oils: Butter. Canola oil. Vegetable oil. Beef fat (tallow). Lard. Sweets and desserts: Cookies. Cakes. Pies. Candy. Seasoning and other foods: Mayonnaise. Premade sauces and marinades. The items listed may not be a complete list. Talk with your dietitian about what dietary choices are right for you. Summary The Mediterranean diet includes both food and lifestyle choices. Eat a  variety of fresh fruits and vegetables, beans, nuts, seeds, and whole grains. Limit the amount of red meat and sweets that you eat. Talk with your health care provider about whether it is safe for you to drink red wine in moderation. This means 1 glass a day for nonpregnant women and 2 glasses a day for men. A glass of wine equals 5 oz (150 mL). This information is not intended to replace advice given to you by your health care provider. Make sure you discuss any questions you have with your health care provider. Document Released: 01/14/2016 Document Revised: 02/16/2016 Document Reviewed: 01/14/2016 Elsevier Interactive Patient Education  2017 [employer].

## 2023-06-11 ENCOUNTER — Other Ambulatory Visit: Payer: Self-pay | Admitting: Physician Assistant

## 2023-08-07 ENCOUNTER — Ambulatory Visit: Payer: Medicare HMO | Admitting: Physician Assistant

## 2023-08-07 ENCOUNTER — Encounter: Payer: Self-pay | Admitting: Physician Assistant

## 2023-08-07 VITALS — BP 121/64 | HR 64 | Resp 20 | Ht 71.0 in | Wt 155.0 lb

## 2023-08-07 DIAGNOSIS — F03B18 Unspecified dementia, moderate, with other behavioral disturbance: Secondary | ICD-10-CM

## 2023-08-07 NOTE — Progress Notes (Signed)
 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 Patient is currently on donepezil 10 mg daily and memantine 10 mg twice daily, and for hallucinations he is on sertraline 100 mg daily Seroquel 50 mg nightly . Cognitive decline is noted. Mood is controlled   Follow up in  6 months. Recommend good control of her cardiovascular risk factors Continue to control mood as per PCP Continue donepezil 10 mg daily and memantine 10 mg twice daily Continue Seroquel 50 mg nightly and Zoloft 50 mg daily Recommend HHN/sitter for monitoring, safety    Subjective:    This patient is accompanied in the office by his son who supplements the history.  Previous records as well as any outside records available were reviewed prior to todays visit. Patient was last seen on 01/12/23    Any changes in memory since last visit? " Worse than before" He is less conversant. Watches TV most of the day, does not want to participate on social activities.  As recalled, he declined attending adult day programs in the recent past. repeats oneself?  Endorsed Disoriented when walking into a room?  Patient denies, sometimes he may not know where he is especially at bedtime    Leaving objects? Endorsed, things are mostly found in drawers   Wandering behavior?  denies   Any personality changes since last visit?  denies   Any worsening depression?:  Denies.   Hallucinations or paranoia? He sees people, kids mostly at night, water on the floor or the house burning. Some nights he is concerned that someone is to break in. Seizures? denies    Any sleep changes? "He lays in the bed and then gets confused about the bedtime, but not every time"  Denies vivid dreams, he talks  in his sleep but denies any other REM behavior or sleepwalking   Sleep apnea?   Denies.   Any hygiene concerns? Needs to be reminded.  Independent of bathing and dressing?  He needs assistance to get dressed, he help to put his shirt and pants on. Does the patient needs help with medications? Son  is in charge  Who is in charge of the finances?  Son is in charge    Any changes in appetite?  Denies. He does not drink enough water.     Patient have trouble swallowing? Denies.   Does the patient cook? No Any headaches?   denies   Chronic back pain  denies   Ambulates with difficulty? Denies. "Legs getting weaker". He declines PT    Recent falls or head injuries? denies     Unilateral weakness, numbness or tingling? denies   Any tremors?  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  Denies  Any bowel dysfunction?   Denies      Patient lives alone with children nearby, rotating to monitor him  Does the patient drive? No longer drives     History on Initial Assessment 04/25/2018: This is a 83 year old right-handed man with a history of hypertension, hyperlipidemia, diabetes, presenting for evaluation of dementia. He feels his memory is "fair, can't remember like before." He lives alone. His sons started noticing changes around a year ago, they would have conversations and he would drift  off to a different topic. His sons report that over the past 6 months, he would be awake at night turning all the lights with visual hallucinations. Last night he saw a little child in a corner with red shoes on. Hallucinations are mostly in the evening, they report he is okay during the daytime. He is easily agitated. He started having paranoia 2 months ago, thinking someone is trying to come in the house or someone is inside the house. He rarely drives but drove a week ago, denies getting lost. His son has always managed his bills. His sons have started fixing his pillbox and making sure he  takes his medications. Sons have started staying with him for the past week due to the worsening hallucinations. His family brought him to the ER yesterday due to worsening symptoms, he had a head CT without contrast which I personally reviewed, no acute changes, there was mild diffuse atrophy and chronic microvascular disease.   He has occasional left hand tremors. He has occasional headaches and dizziness when standing. He denies any diplopia, dysarthria/dysphagia, neck/back pain, focal numbness/tingling/weakness, bowel/bladder dysfunction. He has noticed reduced sense of smell. He naps during the day and gets fidgety at night, but does not wander outside the house. No family history of dementia. No history of significant head injuries or alcohol use.   PREVIOUS MEDICATIONS:   CURRENT MEDICATIONS:  Outpatient Encounter Medications as of 08/07/2023  Medication Sig   amLODipine (NORVASC) 5 MG tablet Take 5 mg by mouth daily.   atorvastatin (LIPITOR) 40 MG tablet Take 20 mg by mouth daily.    Cholecalciferol (VITAMIN D3) 50 MCG (2000 UT) TABS Take 2,000 Units by mouth 2 (two) times daily.   donepezil (ARICEPT) 10 MG tablet TAKE 1 TABLET EVERY DAY   finasteride (PROSCAR) 5 MG tablet Take 5 mg by mouth daily.   lisinopril (PRINIVIL,ZESTRIL) 5 MG tablet Take 10 mg by mouth daily.   memantine (NAMENDA) 10 MG tablet Take 1 tablet (10 mg total) by mouth 2 (two) times daily.   QUEtiapine (SEROQUEL) 50 MG tablet Take 1 tablet (50 mg total) by mouth at bedtime.   sertraline (ZOLOFT) 50 MG tablet Take 1 tablet (50 mg total) by mouth daily.   tamsulosin (FLOMAX) 0.4 MG CAPS capsule Take 0.4 mg by mouth 2 (two) times daily.   No facility-administered encounter medications on file as of 08/07/2023.       07/16/2021    1:00 PM 12/12/2018   10:00 AM 04/25/2018    1:00 PM  MMSE - Mini Mental State Exam  Not completed: Refused    Orientation to time 0 2 3  Orientation to Place 2 3 5   Registration 0 3 3   Attention/ Calculation 0 0 0  Recall 2 1 0  Language- name 2 objects 0 2 2  Language- repeat  0 1  Language- follow 3 step command  3 3  Language- read & follow direction  0 0  Write a sentence  0 0  Copy design  0 0  Total score  14 17       No data to display          Objective:     PHYSICAL EXAMINATION:    VITALS:   Vitals:   08/07/23 0931  BP: 121/64  Pulse: 64  Resp: 20  SpO2: 99%  Weight: 155 lb (70.3 kg)  Height: 5\' 11"  (1.803 m)    GEN:  The patient appears stated age  and is in NAD. HEENT:  Normocephalic, atraumatic.   Neurological examination:  General: NAD, well-groomed, appears stated age. Orientation: The patient is alert. Oriented to person,not to place and date Cranial nerves: There is good facial symmetry. The speech is fluent and clear. No aphasia or dysarthria. Fund of knowledge is reduced. Recent and remote memory are impaired. Attention and concentration are reduced.  Able to name objects and unable to repeat phrases.  Hearing is intact to conversational tone.   Sensation: Sensation is intact to light touch throughout Motor: Strength is at least antigravity x4. DTR's 2/4 in UE/LE     Movement examination: Tone: There is normal tone in the UE/LE Abnormal movements:  no tremor.  No myoclonus.  No asterixis.   Coordination:  There is no with RAM's. Abnormal L finger to nose, some apraxia noted on the L UE.  Gait and Station: The patient has no difficulty arising out of a deep-seated chair without the use of the hands. The patient's stride length is short  Gait is cautious and narrow.    Thank you for allowing [place] the opportunity to participate in the care of this nice patient. Please do not hesitate to contact us for any questions or concerns.   Total time spent on today's visit was 25 minutes dedicated to this patient today, preparing to see patient, examining the patient, ordering tests and/or medications and counseling the patient,  documenting clinical information in the EHR or other health record, independently interpreting results and communicating results to the patient/family, discussing treatment and goals, answering patient's questions and coordinating care.  Cc:  Ardath Sax, FNP  Marlowe Kays 08/07/2023 12:21 PM

## 2023-08-07 NOTE — Patient Instructions (Addendum)
 It was a pleasure to see you today at our office.   Recommendations:  Follow up in 6  months Continue donepezil 10 mg daily.   Continue Memantine 10 mg twice daily. Continue Quetiapine 50 mg at night   Continue sertraline ( zoloft) to 50 mg daily for mood   Counseling regarding caregiver distress, including caregiver depression, anxiety and issues regarding community resources, adult day care programs, adult living facilities, or memory care questions:   Feel free to contact Anthonette Legato, Social Worker at [phone] [facility]   Whom to call:  Memory  decline, memory medications: Call our office [phone]   For psychiatric meds, mood meds: Please have your primary care physician manage these medications.     For assessment of decision of mental capacity and competency:  Call Dr. Erick Blinks, geriatric psychiatrist at [phone]  For guidance in geriatric dementia issues please call [facility] [phone]  For guidance regarding [facility] and if placement were needed at the facility, contact Sidney Ace, Social Worker tel: [phone]  If you have any severe symptoms of a stroke, or other severe issues such as confusion,severe chills or fever, etc call 911 or go to the ER as you may need to be evaluated further      RECOMMENDATIONS FOR ALL PATIENTS WITH MEMORY PROBLEMS: 1. Continue to exercise (Recommend 30 minutes of walking everyday, or 3 hours every week) 2. Increase social interactions - continue going to [place] and enjoy social gatherings with friends and family 3. Eat healthy, avoid fried foods and eat more fruits and vegetables 4. Maintain adequate blood pressure, blood sugar, and blood cholesterol level. Reducing the risk of stroke and cardiovascular disease also helps promoting better memory. 5. Avoid stressful situations. Live a simple life and avoid aggravations. Organize your time and prepare for the  next day in anticipation. 6. Sleep well, avoid any interruptions of sleep and avoid any distractions in the bedroom that may interfere with adequate sleep quality 7. Avoid sugar, avoid sweets as there is a strong link between excessive sugar intake, diabetes, and cognitive impairment We discussed the Mediterranean diet, which has been shown to help patients reduce the risk of progressive memory disorders and reduces cardiovascular risk. This includes eating fish, eat fruits and green leafy vegetables, nuts like almonds and hazelnuts, walnuts, and also use olive oil. Avoid fast foods and fried foods as much as possible. Avoid sweets and sugar as sugar use has been linked to worsening of memory function.  There is always a concern of gradual progression of memory problems. If this is the case, then we may need to adjust level of care according to patient needs. Support, both to the patient and caregiver, should then be put into place.    The Alzheimer's Association is here all day, every day for people facing Alzheimer's disease through our free 24/7 Helpline: [phone]. The Helpline provides reliable information and support to all those who need assistance, such as individuals living with memory loss, Alzheimer's or other dementia, caregivers, health care professionals and the public.  Our highly trained and knowledgeable staff can help you with: Understanding memory loss, dementia and Alzheimer's  Medications and other treatment options  General information about aging and brain health  Skills to provide quality care and to find the best care from professionals  Legal, financial and living-arrangement decisions Our Helpline also features: Confidential care consultation provided by master's level clinicians who can help with decision-making support, crisis  assistance and education on issues families face every day  Help in a caller's preferred language using our translation service that features  more than 200 languages and dialects  Referrals to local community programs, services and ongoing support     FALL PRECAUTIONS: Be cautious when walking. Scan the area for obstacles that may increase the risk of trips and falls. When getting up in the mornings, sit up at the edge of the bed for a few minutes before getting out of bed. Consider elevating the bed at the head end to avoid drop of blood pressure when getting up. Walk always in a well-lit room (use night lights in the walls). Avoid area rugs or power cords from appliances in the middle of the walkways. Use a walker or a cane if necessary and consider physical therapy for balance exercise. Get your eyesight checked regularly.  FINANCIAL OVERSIGHT: Supervision, especially oversight when making financial decisions or transactions is also recommended.  HOME SAFETY: Consider the safety of the kitchen when operating appliances like stoves, microwave oven, and blender. Consider having supervision and share cooking responsibilities until no longer able to participate in those. Accidents with firearms and other hazards in the house should be identified and addressed as well.   ABILITY TO BE LEFT ALONE: If patient is unable to contact 911 operator, consider using LifeLine, or when the need is there, arrange for someone to stay with patients. Smoking is a fire hazard, consider supervision or cessation. Risk of wandering should be assessed by caregiver and if detected at any point, supervision and safe proof recommendations should be instituted.  MEDICATION SUPERVISION: Inability to self-administer medication needs to be constantly addressed. Implement a mechanism to ensure safe administration of the medications.   DRIVING: Regarding driving, in patients with progressive memory problems, driving will be impaired. We advise to have someone else do the driving if trouble finding directions or if minor accidents are reported. Independent driving  assessment is available to determine safety of driving.   If you are interested in the driving assessment, you can contact the following:  [employer] in [place] [phone]  [facility] [phone]  [facility] [phone] or [phone]      Mediterranean Diet A Mediterranean diet refers to food and lifestyle choices that are based on the traditions of countries located on the [employer]. This way of eating has been shown to help prevent certain conditions and improve outcomes for people who have chronic diseases, like kidney disease and heart disease. What are tips for following this plan? Lifestyle  Cook and eat meals together with your family, when possible. Drink enough fluid to keep your urine clear or pale yellow. Be physically active every day. This includes: Aerobic exercise like running or swimming. Leisure activities like gardening, walking, or housework. Get 7-8 hours of sleep each night. If recommended by your health care provider, drink red wine in moderation. This means 1 glass a day for nonpregnant women and 2 glasses a day for men. A glass of wine equals 5 oz (150 mL). Reading food labels  Check the serving size of packaged foods. For foods such as rice and pasta, the serving size refers to the amount of cooked product, not dry. Check the total fat in packaged foods. Avoid foods that have saturated fat or trans fats. Check the ingredients list for added sugars, such as corn syrup. Shopping  At the grocery store, buy most of your food from  the areas near the walls of the store. This includes: Fresh fruits and vegetables (produce). Grains, beans, nuts, and seeds. Some of these may be available in unpackaged forms or large amounts (in bulk). Fresh seafood. Poultry and eggs. Low-fat dairy products. Buy whole ingredients instead of prepackaged foods. Buy fresh fruits and  vegetables in-season from local farmers markets. Buy frozen fruits and vegetables in resealable bags. If you do not have access to quality fresh seafood, buy precooked frozen shrimp or canned fish, such as tuna, salmon, or sardines. Buy small amounts of raw or cooked vegetables, salads, or olives from the deli or salad bar at your store. Stock your pantry so you always have certain foods on hand, such as olive oil, canned tuna, canned tomatoes, rice, pasta, and beans. Cooking  Cook foods with extra-virgin olive oil instead of using butter or other vegetable oils. Have meat as a side dish, and have vegetables or grains as your main dish. This means having meat in small portions or adding small amounts of meat to foods like pasta or stew. Use beans or vegetables instead of meat in common dishes like chili or lasagna. Experiment with different cooking methods. Try roasting or broiling vegetables instead of steaming or sauteing them. Add frozen vegetables to soups, stews, pasta, or rice. Add nuts or seeds for added healthy fat at each meal. You can add these to yogurt, salads, or vegetable dishes. Marinate fish or vegetables using olive oil, lemon juice, garlic, and fresh herbs. Meal planning  Plan to eat 1 vegetarian meal one day each week. Try to work up to 2 vegetarian meals, if possible. Eat seafood 2 or more times a week. Have healthy snacks readily available, such as: Vegetable sticks with hummus. Greek yogurt. Fruit and nut trail mix. Eat balanced meals throughout the week. This includes: Fruit: 2-3 servings a day Vegetables: 4-5 servings a day Low-fat dairy: 2 servings a day Fish, poultry, or lean meat: 1 serving a day Beans and legumes: 2 or more servings a week Nuts and seeds: 1-2 servings a day Whole grains: 6-8 servings a day Extra-virgin olive oil: 3-4 servings a day Limit red meat and sweets to only a few servings a month What are my food choices? Mediterranean  diet Recommended Grains: Whole-grain pasta. Brown rice. Bulgar wheat. Polenta. Couscous. Whole-wheat bread. Orpah Cobb. Vegetables: Artichokes. Beets. Broccoli. Cabbage. Carrots. Eggplant. Green beans. Chard. Kale. Spinach. Onions. Leeks. Peas. Squash. Tomatoes. Peppers. Radishes. Fruits: Apples. Apricots. Avocado. Berries. Bananas. Cherries. Dates. Figs. Grapes. Lemons. Melon. Oranges. Peaches. Plums. Pomegranate. Meats and other protein foods: Beans. Almonds. Sunflower seeds. Pine nuts. Peanuts. Cod. Salmon. Scallops. Shrimp. Tuna. Tilapia. Clams. Oysters. Eggs. Dairy: Low-fat milk. Cheese. Greek yogurt. Beverages: Water. Red wine. Herbal tea. Fats and oils: Extra virgin olive oil. Avocado oil. Grape seed oil. Sweets and desserts: [place] yogurt with honey. Baked apples. Poached pears. Trail mix. Seasoning and other foods: Basil. Cilantro. Coriander. Cumin. Mint. Parsley. Sage. Rosemary. Tarragon. Garlic. Oregano. Thyme. Pepper. Balsalmic vinegar. Tahini. Hummus. Tomato sauce. Olives. Mushrooms. Limit these Grains: Prepackaged pasta or rice dishes. Prepackaged cereal with added sugar. Vegetables: Deep fried potatoes (french fries). Fruits: Fruit canned in syrup. Meats and other protein foods: Beef. Pork. Lamb. Poultry with skin. Hot dogs. Tomasa Blase. Dairy: Ice cream. Sour cream. Whole milk. Beverages: Juice. Sugar-sweetened soft drinks. Beer. Liquor and spirits. Fats and oils: Butter. Canola oil. Vegetable oil. Beef fat (tallow). Lard. Sweets and desserts: Cookies. Cakes. Pies. Candy. Seasoning and other foods: Mayonnaise. Premade sauces  and marinades. The items listed may not be a complete list. Talk with your dietitian about what dietary choices are right for you. Summary The Mediterranean diet includes both food and lifestyle choices. Eat a variety of fresh fruits and vegetables, beans, nuts, seeds, and whole grains. Limit the amount of red meat and sweets that you eat. Talk with your  health care provider about whether it is safe for you to drink red wine in moderation. This means 1 glass a day for nonpregnant women and 2 glasses a day for men. A glass of wine equals 5 oz (150 mL). This information is not intended to replace advice given to you by your health care provider. Make sure you discuss any questions you have with your health care provider. Document Released: 01/14/2016 Document Revised: 02/16/2016 Document Reviewed: 01/14/2016 Elsevier Interactive Patient Education  2017 [employer].

## 2023-11-06 ENCOUNTER — Other Ambulatory Visit: Payer: Self-pay | Admitting: Physician Assistant

## 2023-12-18 ENCOUNTER — Other Ambulatory Visit: Payer: Self-pay | Admitting: Physician Assistant

## 2023-12-18 DIAGNOSIS — F03B18 Unspecified dementia, moderate, with other behavioral disturbance: Secondary | ICD-10-CM

## 2023-12-29 ENCOUNTER — Ambulatory Visit: Admitting: Physician Assistant

## 2023-12-29 ENCOUNTER — Encounter: Payer: Self-pay | Admitting: Physician Assistant

## 2023-12-29 ENCOUNTER — Other Ambulatory Visit: Payer: Self-pay | Admitting: Physician Assistant

## 2023-12-29 VITALS — BP 113/58 | HR 89 | Resp 20 | Ht 71.0 in | Wt 159.0 lb

## 2023-12-29 DIAGNOSIS — F03B18 Unspecified dementia, moderate, with other behavioral disturbance: Secondary | ICD-10-CM | POA: Diagnosis not present

## 2023-12-29 NOTE — Progress Notes (Signed)
 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 Patient is currently on donepezil  10 mg daily and memantine  10 mg twice daily, and for hallucinations he is on sertraline  100 mg daily and Seroquel  50 mg nightly. Memory decline is noted. Mood is controlled with the above medications     Follow up in  6 months. Continue donepezil  10 mg daily and memantine  10 mg twice daily, side effects discussed Continue Seroquel  50 mg nightly and Zoloft  50 mg daily  Recommend good control of her cardiovascular risk factors Recommend HHN /sitter for monitoring, safety      Subjective:    This patient is accompanied in the office by his son who supplements the history.  Previous records as well as any outside records available were reviewed prior to todays visit. Patient was last seen on 08/07/2023    Any changes in memory since last visit?  About  the same.  He is less conversant.  He watches TV most of the day, does not want to participate in any social activities.  As recalled, he declined attending adult day programs in the past. repeats oneself?  Endorsed Disoriented when walking into a room?  During the night, he may become disoriented.   Leaving objects?  He places everything in drawers   Wandering behavior?  denies   Any personality changes since last visit?  Denies.   Any worsening depression?:  Denies.   Hallucinations or paranoia?  He sees people, kids mostly at night, HE sometimes sees the floor house burning.    Seizures? denies    Any sleep changes?  He may confuse days and nights.  He talks in his sleep, denies vivid dreams or nightmares, no other REM behavior or sleepwalking.    Sleep apnea?   Denies.   Any hygiene concerns?   Needs to be reminded Independent of bathing and dressing?  Needs assistance to get dressed, to put his shirt and pants on. Does the patient needs help with medications?  Son  is in charge   Who is in charge of the finances?  Son is in charge     Any changes in appetite?  denies does not drink enough water    Patient have trouble swallowing? Denies.   Does the patient cook? No Any headaches?   denies   Any vision changes?  Chronic back pain  denies   Ambulates with difficulty?  He is more deconditioned, but declines PT  Recent falls or head injuries? Denies.     Unilateral weakness, numbness or tingling? denies   Any tremors?  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  Endorsed   Any bowel dysfunction?   Denies      Patient lives alone with children nearby, rotating to be with him  Does the patient drive? No longer drives     History on Initial Assessment 04/25/2018: This is a 83 year old right-handed man with a history of hypertension, hyperlipidemia, diabetes, presenting for evaluation of dementia. He feels his memory is fair, can't remember like before. He lives alone. His sons started noticing changes around a year ago, they would have conversations and he would drift off to a different topic. His sons report that  over the past 6 months, he would be awake at night turning all the lights with visual hallucinations. Last night he saw a little child in a corner with red shoes on. Hallucinations are mostly in the evening, they report he is okay during the daytime. He is easily agitated. He started having paranoia 2 months ago, thinking someone is trying to come in the house or someone is inside the house. He rarely drives but drove a week ago, denies getting lost. His son has always managed his bills. His sons have started fixing his pillbox and making sure he takes his medications. Sons have started staying with him for the past week due to the worsening  hallucinations. His family brought him to the ER yesterday due to worsening symptoms, he had a head CT without contrast which I personally reviewed, no acute changes, there was mild diffuse atrophy and chronic microvascular disease.   He has occasional left hand tremors. He has occasional headaches and dizziness when standing. He denies any diplopia, dysarthria/dysphagia, neck/back pain, focal numbness/tingling/weakness, bowel/bladder dysfunction. He has noticed reduced sense of smell. He naps during the day and gets fidgety at night, but does not wander outside the house. No family history of dementia. No history of significant head injuries or alcohol use. PREVIOUS MEDICATIONS:   CURRENT MEDICATIONS:  Outpatient Encounter Medications as of 12/29/2023  Medication Sig   amLODipine  (NORVASC ) 5 MG tablet Take 5 mg by mouth daily.   atorvastatin  (LIPITOR) 40 MG tablet Take 20 mg by mouth daily.    Cholecalciferol  (VITAMIN D3) 50 MCG (2000 UT) TABS Take 2,000 Units by mouth 2 (two) times daily.   donepezil  (ARICEPT ) 10 MG tablet TAKE 1 TABLET EVERY DAY   finasteride  (PROSCAR ) 5 MG tablet Take 5 mg by mouth daily.   lisinopril  (PRINIVIL ,ZESTRIL ) 5 MG tablet Take 10 mg by mouth daily.   memantine  (NAMENDA ) 10 MG tablet TAKE 1 TABLET TWICE DAILY   QUEtiapine  (SEROQUEL ) 50 MG tablet TAKE 1 TABLET AT BEDTIME   sertraline  (ZOLOFT ) 50 MG tablet TAKE 1 TABLET EVERY DAY   tamsulosin  (FLOMAX ) 0.4 MG CAPS capsule Take 0.4 mg by mouth 2 (two) times daily.   [DISCONTINUED] memantine  (NAMENDA ) 10 MG tablet Take 1 tablet (10 mg total) by mouth 2 (two) times daily.   No facility-administered encounter medications on file as of 12/29/2023.       07/16/2021    1:00 PM 12/12/2018   10:00 AM 04/25/2018    1:00 PM  MMSE - Mini Mental State Exam  Not completed: Refused    Orientation to time 0 2 3  Orientation to Place 2 3 5   Registration 0 3 3  Attention/ Calculation 0 0 0  Recall 2 1 0  Language- name 2  objects 0 2 2  Language- repeat  0 1  Language- follow 3 step command  3 3  Language- read & follow direction  0 0  Write a sentence  0 0  Copy design  0 0  Total score  14 17       No data to display          Objective:     PHYSICAL EXAMINATION:    VITALS:   Vitals:   12/29/23 1458  BP: (!) 113/58  Pulse: 89  Resp: 20  SpO2: 98%  Weight: 159 lb (72.1 kg)  Height: 5' 11 (1.803 m)    GEN:  The patient appears stated age and is in NAD. HEENT:  Normocephalic, atraumatic.   Neurological examination:  General: NAD, well-groomed, appears stated age. Orientation: The patient is alert. Oriented to person, not to place and date Cranial nerves: There is good facial symmetry.The speech is fluent and clear. No aphasia or dysarthria. Fund of knowledge is reduced. Recent and remote memory are impaired. Attention and concentration are reduced. Able to name objects and repeat phrases.  Hearing is intact to conversational tone.   Sensation: Sensation is intact to light touch throughout Motor: Strength is at least antigravity x4. DTR's 2/4 in UE/LE     Movement examination: Tone: There is normal tone in the UE/LE Abnormal movements:  no tremor.  No myoclonus.  No asterixis.   Coordination:  There is no decremation with RAM's.  Abnormal L FTN, some apraxia noted on the left upper extremity.  Fair  gait and Station: The patient has no difficulty arising out of a deep-seated chair without the use of the hands. The patient's stride length is short.  Gait is cautious and narrow.    Thank you for allowing us  the opportunity to participate in the care of this nice patient. Please do not hesitate to contact us  for any questions or concerns.   Total time spent on today's visit was 26 minutes dedicated to this patient today, preparing to see patient, examining the patient, ordering tests and/or medications and counseling the patient, documenting clinical information in the EHR or other  health record, independently interpreting results and communicating results to the patient/family, discussing treatment and goals, answering patient's questions and coordinating care.  Cc:  Margarete Maeola DASEN, FNP  Camie Sevin 01/01/2024 11:44 AM

## 2023-12-29 NOTE — Patient Instructions (Signed)
 It was a pleasure to see you today at our office.   Recommendations:  Follow up in 6  months Continue donepezil 10 mg daily.   Continue Memantine 10 mg twice daily. Continue Quetiapine 50 mg at night   Continue sertraline ( zoloft) to 50 mg daily for mood   Counseling regarding caregiver distress, including caregiver depression, anxiety and issues regarding community resources, adult day care programs, adult living facilities, or memory care questions:   Feel free to contact Anthonette Legato, Social Worker at [phone] [facility]   Whom to call:  Memory  decline, memory medications: Call our office [phone]   For psychiatric meds, mood meds: Please have your primary care physician manage these medications.     For assessment of decision of mental capacity and competency:  Call Dr. Erick Blinks, geriatric psychiatrist at [phone]  For guidance in geriatric dementia issues please call [facility] [phone]  For guidance regarding [facility] and if placement were needed at the facility, contact Sidney Ace, Social Worker tel: [phone]  If you have any severe symptoms of a stroke, or other severe issues such as confusion,severe chills or fever, etc call 911 or go to the ER as you may need to be evaluated further      RECOMMENDATIONS FOR ALL PATIENTS WITH MEMORY PROBLEMS: 1. Continue to exercise (Recommend 30 minutes of walking everyday, or 3 hours every week) 2. Increase social interactions - continue going to [place] and enjoy social gatherings with friends and family 3. Eat healthy, avoid fried foods and eat more fruits and vegetables 4. Maintain adequate blood pressure, blood sugar, and blood cholesterol level. Reducing the risk of stroke and cardiovascular disease also helps promoting better memory. 5. Avoid stressful situations. Live a simple life and avoid aggravations. Organize your time and prepare for the  next day in anticipation. 6. Sleep well, avoid any interruptions of sleep and avoid any distractions in the bedroom that may interfere with adequate sleep quality 7. Avoid sugar, avoid sweets as there is a strong link between excessive sugar intake, diabetes, and cognitive impairment We discussed the Mediterranean diet, which has been shown to help patients reduce the risk of progressive memory disorders and reduces cardiovascular risk. This includes eating fish, eat fruits and green leafy vegetables, nuts like almonds and hazelnuts, walnuts, and also use olive oil. Avoid fast foods and fried foods as much as possible. Avoid sweets and sugar as sugar use has been linked to worsening of memory function.  There is always a concern of gradual progression of memory problems. If this is the case, then we may need to adjust level of care according to patient needs. Support, both to the patient and caregiver, should then be put into place.    The Alzheimer's Association is here all day, every day for people facing Alzheimer's disease through our free 24/7 Helpline: [phone]. The Helpline provides reliable information and support to all those who need assistance, such as individuals living with memory loss, Alzheimer's or other dementia, caregivers, health care professionals and the public.  Our highly trained and knowledgeable staff can help you with: Understanding memory loss, dementia and Alzheimer's  Medications and other treatment options  General information about aging and brain health  Skills to provide quality care and to find the best care from professionals  Legal, financial and living-arrangement decisions Our Helpline also features: Confidential care consultation provided by master's level clinicians who can help with decision-making support, crisis  assistance and education on issues families face every day  Help in a caller's preferred language using our translation service that features  more than 200 languages and dialects  Referrals to local community programs, services and ongoing support     FALL PRECAUTIONS: Be cautious when walking. Scan the area for obstacles that may increase the risk of trips and falls. When getting up in the mornings, sit up at the edge of the bed for a few minutes before getting out of bed. Consider elevating the bed at the head end to avoid drop of blood pressure when getting up. Walk always in a well-lit room (use night lights in the walls). Avoid area rugs or power cords from appliances in the middle of the walkways. Use a walker or a cane if necessary and consider physical therapy for balance exercise. Get your eyesight checked regularly.  FINANCIAL OVERSIGHT: Supervision, especially oversight when making financial decisions or transactions is also recommended.  HOME SAFETY: Consider the safety of the kitchen when operating appliances like stoves, microwave oven, and blender. Consider having supervision and share cooking responsibilities until no longer able to participate in those. Accidents with firearms and other hazards in the house should be identified and addressed as well.   ABILITY TO BE LEFT ALONE: If patient is unable to contact 911 operator, consider using [facility], or when the need is there, arrange for someone to stay with patients. Smoking is a fire hazard, consider supervision or cessation. Risk of wandering should be assessed by caregiver and if detected at any point, supervision and safe proof recommendations should be instituted.  MEDICATION SUPERVISION: Inability to self-administer medication needs to be constantly addressed. Implement a mechanism to ensure safe administration of the medications.   DRIVING: Regarding driving, in patients with progressive memory problems, driving will be impaired. We advise to have someone else do the driving if trouble finding directions or if minor accidents are reported. Independent driving  assessment is available to determine safety of driving.   If you are interested in the driving assessment, you can contact the following:  [employer] in [place] [phone]  [facility] [phone]  [facility] [phone] or [phone]      Mediterranean Diet A Mediterranean diet refers to food and lifestyle choices that are based on the traditions of countries located on the [employer]. This way of eating has been shown to help prevent certain conditions and improve outcomes for people who have chronic diseases, like kidney disease and heart disease. What are tips for following this plan? Lifestyle  Cook and eat meals together with your family, when possible. Drink enough fluid to keep your urine clear or pale yellow. Be physically active every day. This includes: Aerobic exercise like running or swimming. Leisure activities like gardening, walking, or housework. Get 7-8 hours of sleep each night. If recommended by your health care provider, drink red wine in moderation. This means 1 glass a day for nonpregnant women and 2 glasses a day for men. A glass of wine equals 5 oz (150 mL). Reading food labels  Check the serving size of packaged foods. For foods such as rice and pasta, the serving size refers to the amount of cooked product, not dry. Check the total fat in packaged foods. Avoid foods that have saturated fat or trans fats. Check the ingredients list for added sugars, such as corn syrup. Shopping  At the grocery store, buy most of your food from  the areas near the walls of the store. This includes: Fresh fruits and vegetables (produce). Grains, beans, nuts, and seeds. Some of these may be available in unpackaged forms or large amounts (in bulk). Fresh seafood. Poultry and eggs. Low-fat dairy products. Buy whole ingredients instead of prepackaged foods. Buy fresh fruits and  vegetables in-season from local farmers markets. Buy frozen fruits and vegetables in resealable bags. If you do not have access to quality fresh seafood, buy precooked frozen shrimp or canned fish, such as tuna, salmon, or sardines. Buy small amounts of raw or cooked vegetables, salads, or olives from the deli or salad bar at your store. Stock your pantry so you always have certain foods on hand, such as olive oil, canned tuna, canned tomatoes, rice, pasta, and beans. Cooking  Cook foods with extra-virgin olive oil instead of using butter or other vegetable oils. Have meat as a side dish, and have vegetables or grains as your main dish. This means having meat in small portions or adding small amounts of meat to foods like pasta or stew. Use beans or vegetables instead of meat in common dishes like chili or lasagna. Experiment with different cooking methods. Try roasting or broiling vegetables instead of steaming or sauteing them. Add frozen vegetables to soups, stews, pasta, or rice. Add nuts or seeds for added healthy fat at each meal. You can add these to yogurt, salads, or vegetable dishes. Marinate fish or vegetables using olive oil, lemon juice, garlic, and fresh herbs. Meal planning  Plan to eat 1 vegetarian meal one day each week. Try to work up to 2 vegetarian meals, if possible. Eat seafood 2 or more times a week. Have healthy snacks readily available, such as: Vegetable sticks with hummus. Greek yogurt. Fruit and nut trail mix. Eat balanced meals throughout the week. This includes: Fruit: 2-3 servings a day Vegetables: 4-5 servings a day Low-fat dairy: 2 servings a day Fish, poultry, or lean meat: 1 serving a day Beans and legumes: 2 or more servings a week Nuts and seeds: 1-2 servings a day Whole grains: 6-8 servings a day Extra-virgin olive oil: 3-4 servings a day Limit red meat and sweets to only a few servings a month What are my food choices? Mediterranean  diet Recommended Grains: Whole-grain pasta. Brown rice. Bulgar wheat. Polenta. Couscous. Whole-wheat bread. Orpah Cobb. Vegetables: Artichokes. Beets. Broccoli. Cabbage. Carrots. Eggplant. Green beans. Chard. Kale. Spinach. Onions. Leeks. Peas. Squash. Tomatoes. Peppers. Radishes. Fruits: Apples. Apricots. Avocado. Berries. Bananas. Cherries. Dates. Figs. Grapes. Lemons. Melon. Oranges. Peaches. Plums. Pomegranate. Meats and other protein foods: Beans. Almonds. Sunflower seeds. Pine nuts. Peanuts. Cod. Salmon. Scallops. Shrimp. Tuna. Tilapia. Clams. Oysters. Eggs. Dairy: Low-fat milk. Cheese. Greek yogurt. Beverages: Water. Red wine. Herbal tea. Fats and oils: Extra virgin olive oil. Avocado oil. Grape seed oil. Sweets and desserts: [place] yogurt with honey. Baked apples. Poached pears. Trail mix. Seasoning and other foods: Basil. Cilantro. Coriander. Cumin. Mint. Parsley. Sage. Rosemary. Tarragon. Garlic. Oregano. Thyme. Pepper. Balsalmic vinegar. Tahini. Hummus. Tomato sauce. Olives. Mushrooms. Limit these Grains: Prepackaged pasta or rice dishes. Prepackaged cereal with added sugar. Vegetables: Deep fried potatoes (french fries). Fruits: Fruit canned in syrup. Meats and other protein foods: Beef. Pork. Lamb. Poultry with skin. Hot dogs. Tomasa Blase. Dairy: Ice cream. Sour cream. Whole milk. Beverages: Juice. Sugar-sweetened soft drinks. Beer. Liquor and spirits. Fats and oils: Butter. Canola oil. Vegetable oil. Beef fat (tallow). Lard. Sweets and desserts: Cookies. Cakes. Pies. Candy. Seasoning and other foods: Mayonnaise. Premade sauces  and marinades. The items listed may not be a complete list. Talk with your dietitian about what dietary choices are right for you. Summary The Mediterranean diet includes both food and lifestyle choices. Eat a variety of fresh fruits and vegetables, beans, nuts, seeds, and whole grains. Limit the amount of red meat and sweets that you eat. Talk with your  health care provider about whether it is safe for you to drink red wine in moderation. This means 1 glass a day for nonpregnant women and 2 glasses a day for men. A glass of wine equals 5 oz (150 mL). This information is not intended to replace advice given to you by your health care provider. Make sure you discuss any questions you have with your health care provider. Document Released: 01/14/2016 Document Revised: 02/16/2016 Document Reviewed: 01/14/2016 Elsevier Interactive Patient Education  2017 [employer].

## 2024-01-19 ENCOUNTER — Other Ambulatory Visit: Payer: Self-pay | Admitting: Physician Assistant

## 2024-02-14 ENCOUNTER — Ambulatory Visit: Admitting: Physician Assistant

## 2024-03-11 ENCOUNTER — Other Ambulatory Visit: Payer: Self-pay | Admitting: Nephrology

## 2024-03-11 DIAGNOSIS — N1832 Chronic kidney disease, stage 3b: Secondary | ICD-10-CM

## 2024-03-11 DIAGNOSIS — I10 Essential (primary) hypertension: Secondary | ICD-10-CM

## 2024-03-15 ENCOUNTER — Ambulatory Visit (HOSPITAL_COMMUNITY)
Admission: RE | Admit: 2024-03-15 | Discharge: 2024-03-15 | Disposition: A | Discharge status: Home / Self Care | Source: Ambulatory Visit | Attending: Nephrology | Admitting: Nephrology

## 2024-03-15 DIAGNOSIS — N1832 Chronic kidney disease, stage 3b: Secondary | ICD-10-CM | POA: Insufficient documentation

## 2024-03-15 DIAGNOSIS — I10 Essential (primary) hypertension: Secondary | ICD-10-CM | POA: Insufficient documentation

## 2024-04-29 ENCOUNTER — Other Ambulatory Visit: Payer: Self-pay | Admitting: Urology

## 2024-04-29 DIAGNOSIS — D49512 Neoplasm of unspecified behavior of left kidney: Secondary | ICD-10-CM

## 2024-04-30 ENCOUNTER — Encounter: Payer: Self-pay | Admitting: Urology

## 2024-06-19 ENCOUNTER — Ambulatory Visit (HOSPITAL_COMMUNITY)
Admission: RE | Admit: 2024-06-19 | Discharge: 2024-06-19 | Disposition: A | Discharge status: Home / Self Care | Source: Ambulatory Visit | Attending: Urology | Admitting: Urology

## 2024-06-19 DIAGNOSIS — D49512 Neoplasm of unspecified behavior of left kidney: Secondary | ICD-10-CM | POA: Diagnosis present

## 2024-06-19 MED ORDER — GADOBUTROL 1 MMOL/ML IV SOLN
7.0000 mL | Freq: Once | INTRAVENOUS | Status: AC | PRN
  Administered 2024-06-19: 7 mL via INTRAVENOUS

## 2024-07-07 DEATH — deceased

## 2024-07-08 NOTE — Progress Notes (Incomplete)
 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 Patient is currently on memantine  10 mg twice a day and donepezil  10 mg daily.  For hallucinations and other mood issues he is on sertraline  100 mg daily and Seroquel  50 mg nightly***.   Patient was last seen on 12/29/2023***. Memory is ***. MMSE today is  /30. Patient is able to participate on ADLs and continues to drive without difficulties. Mood is*** . This patient is accompanied in the office by his son*** who supplements the history.  Previous records as well as any outside records available were reviewed prior to todays visit.   Follow up in  months Continue donepezil  10 mg daily and memantine  10 mg twice a day, side effects discussed Continue Seroquel  50 mg nightly and Zoloft  50 mg daily, side effects discussed Recommend good control of cardiovascular risk factors.   Recommend HHN-sitter for monitoring and safety   Discussed the use of AI scribe software for clinical note transcription with the patient, who gave verbal consent to proceed.  History of Present Illness  Any changes in memory since last visit?  About  the same.  He is less conversant.  He watches TV most of the day, does not want to participate in any social activities.  As recalled, he declined attending adult day programs in the past. repeats oneself?  Endorsed Disoriented when walking into a room?  During the night, he may become disoriented.   Leaving objects?  He places everything in drawers   Wandering behavior?  denies   Any personality changes since last visit?  Denies.   Any worsening depression?:  Denies.   Hallucinations or paranoia?  He sees people, kids mostly at night, HE sometimes sees the floor  house burning.    Seizures? denies    Any sleep changes?  He may confuse days and nights.  He talks in his sleep, denies vivid dreams or nightmares, no other REM behavior or sleepwalking.    Sleep apnea?   Denies.   Any hygiene concerns?  Needs to be reminded Independent of bathing and dressing?  Needs assistance to get dressed, to put his shirt and pants on. Does the patient needs help with medications?  Son  is in charge   Who is in charge of the finances?  Son is in charge     Any changes in appetite?  denies does not drink enough water    Patient have trouble swallowing? Denies.   Does the patient cook? No Any headaches?   denies   Any vision changes?  Chronic back pain  denies   Ambulates with difficulty?  He is more deconditioned, but declines PT  Recent falls or head injuries? Denies.     Unilateral weakness, numbness or tingling? denies   Any tremors?  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  Endorsed   Any bowel dysfunction?   Denies      Patient lives alone with children nearby, rotating to be with him  Does the patient drive? No longer drives      History on Initial Assessment 04/25/2018: This is a 84 year old right-handed man with a history of hypertension, hyperlipidemia, diabetes, presenting for evaluation of dementia.  He feels his memory is fair, can't remember like before. He lives alone. His sons started noticing changes around a year ago, they would have conversations and he would drift off to a different topic. His sons report that over the past 6 months, he would be awake at night turning all the lights with visual hallucinations. Last night he saw a little child in a corner with red shoes on. Hallucinations are mostly in the evening, they report he is okay during the daytime. He is easily agitated. He started having paranoia 2 months ago, thinking someone is trying to come in the house or someone is inside the house. He rarely drives but  drove a week ago, denies getting lost. His son has always managed his bills. His sons have started fixing his pillbox and making sure he takes his medications. Sons have started staying with him for the past week due to the worsening hallucinations. His family brought him to the ER yesterday due to worsening symptoms, he had a head CT without contrast which I personally reviewed, no acute changes, there was mild diffuse atrophy and chronic microvascular disease.   He has occasional left hand tremors. He has occasional headaches and dizziness when standing. He denies any diplopia, dysarthria/dysphagia, neck/back pain, focal numbness/tingling/weakness, bowel/bladder dysfunction. He has noticed reduced sense of smell. He naps during the day and gets fidgety at night, but does not wander outside the house. No family history of dementia. No history of significant head injuries or alcohol use.           07/16/2021    1:00 PM 12/12/2018   10:00 AM 04/25/2018    1:00 PM  MMSE - Mini Mental State Exam  Not completed: Refused    Orientation to time 0 2 3  Orientation to Place 2 3 5   Registration 0 3 3  Attention/ Calculation 0 0 0  Recall 2 1 0  Language- name 2 objects 0 2 2  Language- repeat  0 1  Language- follow 3 step command  3 3  Language- read & follow direction  0 0  Write a sentence  0 0  Copy design  0 0  Total score  14 17       No data to display            Objective:    Neurological Exam:    VITALS:  There were no vitals filed for this visit.  GEN:  The patient appears stated age and is in NAD. HEENT:  Normocephalic, atraumatic.   Neurological examination:  General: NAD, well-groomed, appears stated age. Orientation: The patient is alert. Oriented to person, not to place and not to date Cranial nerves: There is good facial symmetry.The speech is fluent and clear. No aphasia or dysarthria. Fund of knowledge is reduced. Recent and remote memory are impaired. Attention  and concentration are reduced. Able to name objects and repeat phrases.  Hearing is intact to conversational tone. *** Sensation: Sensation is intact to light touch throughout Motor: Strength is at least antigravity x4. DTR's 2/4 in UE/LE     Movement examination:  Tone: There is normal tone in the UE/LE Abnormal movements:  no tremor.  No myoclonus.  No asterixis.   Coordination:  There is no decremation with RAM's.  Abnormal left finger to nose some apraxia on the left upper extremity. Gait and Station: The patient has no*** difficulty arising out of a deep-seated chair without the use of the hands. The patient's  stride length is short.  Gait is cautious and narrow.    Thank you for allowing us  the opportunity to participate in the care of this nice patient. Please do not hesitate to contact us  for any questions or concerns.   Total time spent on today's visit was *** minutes dedicated to this patient today, preparing to see patient, examining the patient, ordering tests and/or medications and counseling the patient, documenting clinical information in the EHR or other health record, independently interpreting results and communicating results to the patient/family, discussing treatment and goals, answering patient's questions and coordinating care.  Cc:  Margarete Maeola DASEN, FNP  Camie Sevin 07/08/2024 10:02 AM      "

## 2024-07-09 ENCOUNTER — Ambulatory Visit: Admitting: Physician Assistant
